# Patient Record
Sex: Female | Born: 1945 | Race: White | Hispanic: No | Marital: Married | Smoking: Never smoker
Health system: Southern US, Community
[De-identification: ages and names within clinical notes are randomized; demographics above are authoritative.]

## PROBLEM LIST (undated history)

## (undated) DIAGNOSIS — I639 Cerebral infarction, unspecified: Secondary | ICD-10-CM

## (undated) DIAGNOSIS — E785 Hyperlipidemia, unspecified: Secondary | ICD-10-CM

## (undated) HISTORY — PX: BREAST SURGERY: SHX581

## (undated) HISTORY — DX: Hyperlipidemia, unspecified: E78.5

## (undated) HISTORY — DX: Cerebral infarction, unspecified: I63.9

---

## 2000-03-15 ENCOUNTER — Other Ambulatory Visit: Admission: RE | Admit: 2000-03-15 | Discharge: 2000-03-15 | Payer: Self-pay | Admitting: Internal Medicine

## 2001-06-06 ENCOUNTER — Other Ambulatory Visit: Admission: RE | Admit: 2001-06-06 | Discharge: 2001-06-06 | Payer: Self-pay | Admitting: Internal Medicine

## 2003-04-04 ENCOUNTER — Other Ambulatory Visit: Admission: RE | Admit: 2003-04-04 | Discharge: 2003-04-04 | Payer: Self-pay | Admitting: Internal Medicine

## 2005-01-17 ENCOUNTER — Ambulatory Visit (HOSPITAL_COMMUNITY): Admission: RE | Admit: 2005-01-17 | Discharge: 2005-01-17 | Payer: Self-pay | Admitting: Gastroenterology

## 2005-05-25 ENCOUNTER — Emergency Department (HOSPITAL_COMMUNITY): Admission: EM | Admit: 2005-05-25 | Discharge: 2005-05-25 | Payer: Self-pay | Admitting: Family Medicine

## 2005-08-01 ENCOUNTER — Encounter: Admission: RE | Admit: 2005-08-01 | Discharge: 2005-08-01 | Payer: Self-pay | Admitting: Gastroenterology

## 2006-08-16 ENCOUNTER — Other Ambulatory Visit: Admission: RE | Admit: 2006-08-16 | Discharge: 2006-08-16 | Payer: Self-pay | Admitting: Family Medicine

## 2007-08-06 ENCOUNTER — Emergency Department (HOSPITAL_COMMUNITY): Admission: EM | Admit: 2007-08-06 | Discharge: 2007-08-06 | Payer: Self-pay | Admitting: Emergency Medicine

## 2007-08-27 ENCOUNTER — Other Ambulatory Visit: Admission: RE | Admit: 2007-08-27 | Discharge: 2007-08-27 | Payer: Self-pay | Admitting: Family Medicine

## 2008-11-06 ENCOUNTER — Other Ambulatory Visit: Admission: RE | Admit: 2008-11-06 | Discharge: 2008-11-06 | Payer: Self-pay | Admitting: Family Medicine

## 2010-08-06 ENCOUNTER — Other Ambulatory Visit: Admission: RE | Admit: 2010-08-06 | Discharge: 2010-08-06 | Payer: Self-pay | Admitting: Family Medicine

## 2011-03-18 NOTE — Op Note (Signed)
Tammy Erickson, Tammy Erickson               ACCOUNT NO.:  [id number]   MEDICAL RECORD NO.:  [id number]          PATIENT TYPE:  AMB   LOCATION:  ENDO                         FACILITY:  [facility]   PHYSICIAN:  Bernette Redbird, M.D.   DATE OF BIRTH:  Nov 12, 1945   DATE OF PROCEDURE:  01/17/2005  DATE OF DISCHARGE:                                 OPERATIVE REPORT   PROCEDURE:  Colonoscopy.   INDICATIONS:  A 65 year old female for colon cancer screening.   FINDINGS:  Normal exam.   PROCEDURE:  The nature, purpose, and risks of the procedure have been  discussed with the patient who provided written consent. Sedation was  fentanyl 130 mcg and Versed 10 mg IV prior to and during the course of this  procedure and the upper endoscopy which preceded it. The patient was  clinically stable throughout.   The Olympus, adjustable-tension, pediatric videocolonoscope was used for  this exam. It looped quite a bit, suggesting the adult scope might be better  on future occasions, although the sigmoid area was a little bit fixated for  the scope to negotiate.   With the help of the patient in the supine position and the external  abdominal compression, taking out loops and overriding additional loops, I  was able to get around the colon reach the cecum and enter the terminal  ileum which had a normal appearance and pullback was then performed. The  quality of prep was excellent; and it was felt that all areas were well  seen.   This was normal examination. No polyps, cancer, colitis, vascular  malformations, or diverticulosis were noted.  In retroflexion the rectum, as  well as, pullout through the anal canal demonstrated moderate internal  hemorrhoids with reinspection of the rectum being otherwise negative.   No biopsies were obtained. The patient tolerated the procedure well and  there no apparent complications.   IMPRESSION:  Normal screening colonoscopy (V76.51).   PLAN:  Flexible sigmoidoscopy in 5  years for continued screening.      RB/MEDQ  D:  01/17/2005  T:  01/17/2005  Job:  [id number]   cc:   Sharlet Salina, M.D.  [address]  [place]  [place] [address]  Fax: [phone]

## 2011-03-18 NOTE — Op Note (Signed)
Tammy Erickson, HANCOX NO.:  [id number]   MEDICAL RECORD NO.:  [id number]          PATIENT TYPE:  AMB   LOCATION:  ENDO                         FACILITY:  [facility]   PHYSICIAN:  Bernette Redbird, M.D.   DATE OF BIRTH:  November 21, 1945   DATE OF PROCEDURE:  01/17/2005  DATE OF DISCHARGE:                                 OPERATIVE REPORT   PROCEDURE:  Upper endoscopy Savary dilatation of esophagus.   INDICATION:  Intermittent dysphagia symptoms.   FINDINGS:  Changes suggestive of chronic reflux. Esophageal ring. Dilatation  performed to 18 mm.   PROCEDURE:  The nature, purpose and risks of the procedure had been  discussed with the patient who provided written consent. Sedation was  fentanyl 75 mcg and Versed 7.5 mg IV without arrhythmias or desaturation.  The Olympus small-caliber adult video endoscope was passed under direct  vision. The larynx looked entirely normal. The esophagus was readily  entered.   The mucosa of the distal esophagus to me looked a little bit rumpled and as  though there was squamous irregularity from chronic reflux exposure, without  any mass effect, active esophagitis, Barrett's esophagus, neoplasia, varices  or infection. There was Schatzki's ring below which was a small hiatal  hernia. The stomach contained no residual and had normal mucosa including a  retroflexed view of the cardia and the pylorus, duodenal bulb and second  duodenum looked normal. No gastritis, duodenitis, erosions, ulcers, polyps  or masses were seen.   Savary dilatation was performed in the standard fashion. Spring tipped  guidewire was advanced into the antrum of the stomach, the scope was removed  in an exchange fashion, and after appropriate confirmation of positioning of  the wire via fluoroscopy, a single passage was made with an 18 mm Savary  dilator, encountering some smooth resistance in the hypopharyngeal region  and then some degree of smooth resistance distally.  It was left in place a  few seconds and then removed with the guidewire after which I re-endoscope  the patient, which showed broad hemorrhagic slit in the distal esophagus  consistent with mucosal disruption but without evidence of free perforation.  It also appeared that her esophageal ring may have been fractured as well.  The stomach and hypopharynx looked okay.   The patient tolerated the procedure well and there no apparent  complications.   IMPRESSION:  1.  Dysphagia symptoms, now status post esophageal dilatation 18 mm (787.2)  2.  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 Based on      endoscopic findings postdilatation.   PLAN:  Clinical follow-up dysphagia symptoms. Initiate PPI therapy with  Prilosec OTC, office follow-up in about two months.      RB/MEDQ  D:  01/17/2005  T:  01/17/2005  Job:  [id number]

## 2011-08-09 ENCOUNTER — Other Ambulatory Visit: Payer: Self-pay | Admitting: Physician Assistant

## 2011-08-09 ENCOUNTER — Other Ambulatory Visit (HOSPITAL_COMMUNITY)
Admission: RE | Admit: 2011-08-09 | Discharge: 2011-08-09 | Disposition: A | Discharge status: Home / Self Care | Payer: BC Managed Care – PPO | Source: Ambulatory Visit | Attending: Family Medicine | Admitting: Family Medicine

## 2011-08-09 DIAGNOSIS — Z124 Encounter for screening for malignant neoplasm of cervix: Secondary | ICD-10-CM | POA: Insufficient documentation

## 2011-08-11 LAB — I-STAT 8, (EC8 V) (CONVERTED LAB)
Acid-Base Excess: 2
BUN: 12
Bicarbonate: 27.3 — ABNORMAL HIGH
Chloride: 107
Glucose, Bld: 95
HCT: 38
Hemoglobin: 12.9
Operator id: 151321
Potassium: 4
Sodium: 140
TCO2: 29
pCO2, Ven: 43.7 — ABNORMAL LOW
pH, Ven: 7.404 — ABNORMAL HIGH

## 2011-08-11 LAB — CBC
HCT: 36.8
Hemoglobin: 12.7
MCHC: 34.6
MCV: 90.6
Platelets: 256
RBC: 4.06
RDW: 12.6
WBC: 6.3

## 2011-08-11 LAB — DIFFERENTIAL
Basophils Absolute: 0
Basophils Relative: 0
Eosinophils Absolute: 0.2
Eosinophils Relative: 3
Lymphocytes Relative: 25
Lymphs Abs: 1.6
Monocytes Absolute: 0.5
Monocytes Relative: 9
Neutro Abs: 4
Neutrophils Relative %: 63

## 2011-08-11 LAB — POCT CARDIAC MARKERS
CKMB, poc: <1 — ABNORMAL LOW
CKMB, poc: <1 — ABNORMAL LOW
Myoglobin, poc: 52.9
Myoglobin, poc: 53.1
Operator id: 151321
Operator id: 151321
Troponin i, poc: <0.05
Troponin i, poc: <0.05

## 2011-08-11 LAB — POCT I-STAT CREATININE
Creatinine, Ser: 0.8
Operator id: 151321

## 2012-08-09 ENCOUNTER — Other Ambulatory Visit: Payer: Self-pay | Admitting: Physician Assistant

## 2012-08-09 ENCOUNTER — Other Ambulatory Visit (HOSPITAL_COMMUNITY)
Admission: RE | Admit: 2012-08-09 | Discharge: 2012-08-09 | Disposition: A | Discharge status: Home / Self Care | Payer: BC Managed Care – PPO | Source: Ambulatory Visit | Attending: Family Medicine | Admitting: Family Medicine

## 2012-08-09 DIAGNOSIS — Z124 Encounter for screening for malignant neoplasm of cervix: Secondary | ICD-10-CM | POA: Insufficient documentation

## 2016-08-28 ENCOUNTER — Emergency Department (HOSPITAL_BASED_OUTPATIENT_CLINIC_OR_DEPARTMENT_OTHER): Payer: BC Managed Care – PPO

## 2016-08-28 ENCOUNTER — Encounter (HOSPITAL_BASED_OUTPATIENT_CLINIC_OR_DEPARTMENT_OTHER): Payer: Self-pay | Admitting: Emergency Medicine

## 2016-08-28 ENCOUNTER — Emergency Department (HOSPITAL_BASED_OUTPATIENT_CLINIC_OR_DEPARTMENT_OTHER)
Admission: EM | Admit: 2016-08-28 | Discharge: 2016-08-28 | Disposition: A | Discharge status: Home / Self Care | Payer: BC Managed Care – PPO | Attending: Emergency Medicine | Admitting: Emergency Medicine

## 2016-08-28 DIAGNOSIS — W109XXA Fall (on) (from) unspecified stairs and steps, initial encounter: Secondary | ICD-10-CM | POA: Insufficient documentation

## 2016-08-28 DIAGNOSIS — S99912A Unspecified injury of left ankle, initial encounter: Secondary | ICD-10-CM | POA: Diagnosis present

## 2016-08-28 DIAGNOSIS — Y999 Unspecified external cause status: Secondary | ICD-10-CM | POA: Insufficient documentation

## 2016-08-28 DIAGNOSIS — S82832A Other fracture of upper and lower end of left fibula, initial encounter for closed fracture: Secondary | ICD-10-CM

## 2016-08-28 DIAGNOSIS — S89392A Other physeal fracture of lower end of left fibula, initial encounter for closed fracture: Secondary | ICD-10-CM | POA: Diagnosis not present

## 2016-08-28 DIAGNOSIS — Y929 Unspecified place or not applicable: Secondary | ICD-10-CM | POA: Insufficient documentation

## 2016-08-28 DIAGNOSIS — Y939 Activity, unspecified: Secondary | ICD-10-CM | POA: Insufficient documentation

## 2016-08-28 MED ORDER — HYDROCODONE-ACETAMINOPHEN 5-325 MG PO TABS: 0.5000 | ORAL_TABLET | Freq: Four times a day (QID) | ORAL | 0 refills | Status: DC | PRN

## 2016-08-28 MED ORDER — ACETAMINOPHEN 325 MG PO TABS
650.0000 mg | ORAL_TABLET | Freq: Once | ORAL | Status: AC
  Administered 2016-08-28: 650 mg via ORAL
  Filled 2016-08-28: qty 2

## 2016-08-28 NOTE — ED Provider Notes (Signed)
[facility] Note   CSN: [id number] Arrival date & time: 08/28/16  1817  By signing my name below, I, Clovis PuAvnee Patel, attest that this documentation has been prepared under the direction and in the presence of  Arvilla MeresAshley Meyer, PA-C. Electronically Signed: Clovis PuAvnee Patel, ED Scribe. 08/28/16. 8:20 PM.  History   Chief Complaint Chief Complaint  Patient presents with  . Ankle Pain    The history is provided by the patient. No language interpreter was used.   HPI Comments:  Tammy Erickson is a 70 y.o. female who presents to the Emergency Department complaining of sudden onset, moderate "8/10 throbbing" L ankle pain s/p an incident which occurred PTA. Pt states she missed a step on the stairs, her left foot bent backwards, and she felt a pop. She notes associated swelling to her L ankle. Pt denies head injury, LOC, numbness to her extremity, fevers, and being on any blood thinners. No alleviating factors noted. Pt denies any other symptoms or complaints at this time. No treatments tried PTA.   History reviewed. No pertinent past medical history.  There are no active problems to display for this patient.   Past Surgical History:  Procedure Laterality Date  . BREAST SURGERY      OB History    No data available       Home Medications    Prior to Admission medications   Medication Sig Start Date End Date Taking? Authorizing Provider  HYDROcodone-acetaminophen (NORCO/VICODIN) 5-325 MG tablet Take 0.5 tablets by mouth every 6 (six) hours as needed. 08/28/16   Lona KettleAshley Laurel Meyer, PA-C    Family History No family history on file.  Social History Social History  Substance Use Topics  . Smoking status: Never Smoker  . Smokeless tobacco: Never Used  . Alcohol use Yes     Allergies   Codeine; Doxycycline; Motrin [ibuprofen]; Penicillins; and Sulfa antibiotics   Review of Systems Review of Systems  Constitutional: Negative for fever.  Musculoskeletal:  Positive for arthralgias and joint swelling.  Neurological: Negative for syncope, numbness and headaches.     Physical Exam Updated Vital Signs BP 160/89 (BP Location: Left Arm)   Pulse 64   Temp 98.7 F (37.1 C) (Oral)   Resp 20   Ht 5\' 3"  (1.6 m)   Wt 72.6 kg   SpO2 98%   BMI 28.34 kg/m   Physical Exam  Constitutional: She appears well-developed and well-nourished. No distress.  HENT:  Head: Normocephalic and atraumatic.  Eyes: Conjunctivae are normal. No scleral icterus.  Neck: Normal range of motion.  Pulmonary/Chest: Effort normal. No respiratory distress.  Musculoskeletal: She exhibits edema and tenderness.       Left ankle: She exhibits swelling. Tenderness. Lateral malleolus tenderness found.  Left ankle:Obvious swelling to lateral malleolus. TTP of posterior lateral malleolus. Able to move toes. Sensation intact. DP pulse intact. Capillary refill <3seconds.   Neurological: She is alert. She is not disoriented. GCS eye subscore is 4. GCS verbal subscore is 5. GCS motor subscore is 6.  Skin: Skin is warm and dry. She is not diaphoretic.  Psychiatric: She has a normal mood and affect. Her behavior is normal.  Nursing note and vitals reviewed.    ED Treatments / Results  DIAGNOSTIC STUDIES:  Oxygen Saturation is 98% on RA, normal by my interpretation.    COORDINATION OF CARE:  8:18 PM Discussed treatment plan with pt at bedside and pt agreed to plan.  Labs (all labs ordered are  listed, but only abnormal results are displayed) Labs Reviewed - No data to display  EKG  EKG Interpretation None       Radiology Dg Ankle Complete Left  Result Date: 08/28/2016 CLINICAL DATA:  Left ankle pain and swelling after a fall on stairs today. EXAM: LEFT ANKLE COMPLETE - 3+ VIEW COMPARISON:  None. FINDINGS: There is a minimally displaced spiral fracture of the distal fibula with overlying soft tissue swelling. Small ankle effusion. Slight dorsal spurring at the  talonavicular joint. IMPRESSION: Minimally displaced spiral fracture of the distal fibula. Electronically Signed   By: Francene BoyersJames  Maxwell M.D.   On: 08/28/2016 18:48    Procedures Procedures (including critical care time)  Medications Ordered in ED Medications  acetaminophen (TYLENOL) tablet 650 mg (650 mg Oral Given 08/28/16 2031)     Initial Impression / Assessment and Plan / ED Course  I have reviewed the triage vital signs and the nursing notes.  Pertinent labs & imaging results that were available during my care of the patient were reviewed by me and considered in my medical decision making (see chart for details).  Clinical Course  Value Comment By Time  DG Ankle Complete Left Minimally displaced distal fibula fracture.  Lona Kettleshley Laurel Meyer, [place] 10/29 2023    Patient presents to ED with left ankle pain and swelling s/p injury onset today. Patient is afebrile and non-toxic appearing in NAD. VSS. Obvious swelling to left lateral malleolus with TTP. Neurovascularly intact. Patient X-Ray positive for left minimally displaced distal spiral fibula fracture. Patient given boot while in ED, conservative therapy recommended and discussed. Rx vicodin for severe pain. Review of [place] controlled substance database shows no recent narcotic rx. Pt advised to follow up with orthopedics, contact information provided. Patient will be discharged home & is agreeable with above plan. Returns precautions discussed. Patient voiced understanding and is agreeable. Pt appears safe for discharge.   Final Clinical Impressions(s) / ED Diagnoses   Final diagnoses:  Closed fracture of distal end of left fibula, unspecified fracture morphology, initial encounter    New Prescriptions New Prescriptions   HYDROCODONE-ACETAMINOPHEN (NORCO/VICODIN) 5-325 MG TABLET    Take 0.5 tablets by mouth every 6 (six) hours as needed.  I personally performed the services described in this documentation, which was scribed in my  presence. The recorded information has been reviewed and is accurate.     Lona Kettleshley Laurel Meyer, [place]-C 08/28/16 2038    Shaune Pollackameron Isaacs, MD 08/29/16 [phone]

## 2016-08-28 NOTE — ED Notes (Signed)
Pt states she missed a step and her left foot bent back. C/O pain and swelling to same. Moves toes. Feels touch. Cap refill < 3 sec. Ice applied.

## 2016-08-28 NOTE — ED Notes (Signed)
Pt and husband given d/c instructions as per chart. Verbalizes understanding. No questions. Rx x 1 with precautions.

## 2016-08-28 NOTE — Discharge Instructions (Signed)
Read the information below.  You have a fracture in your fibula. You are being placed in a walking boot. You can take tylenol 650mg  every 4-6hrs for pain relief. Elevate leg for 20 minute increments.  Please call orthopedics tomorrow to schedule a follow up appointment for further management.  You may return to the Emergency Department at any time for worsening condition or any new symptoms that concern you. Return to ED if uncontrolled pain, numbness in toes, increased swelling, or change in color of toes.

## 2016-08-28 NOTE — ED Notes (Signed)
PA at bedside.

## 2016-08-28 NOTE — ED Triage Notes (Signed)
Pt missed a step on the stairs and twisted her L ankle, c/o pain, swelling noted.

## 2016-09-01 ENCOUNTER — Encounter: Payer: Self-pay | Admitting: Family Medicine

## 2016-09-01 ENCOUNTER — Ambulatory Visit (INDEPENDENT_AMBULATORY_CARE_PROVIDER_SITE_OTHER): Payer: BC Managed Care – PPO | Admitting: Family Medicine

## 2016-09-01 DIAGNOSIS — S99912A Unspecified injury of left ankle, initial encounter: Secondary | ICD-10-CM

## 2016-09-01 MED ORDER — HYDROCODONE-ACETAMINOPHEN 5-325 MG PO TABS: 1.0000 | ORAL_TABLET | Freq: Four times a day (QID) | ORAL | 0 refills | Status: DC | PRN

## 2016-09-01 NOTE — Patient Instructions (Signed)
You have a distal fibula fracture. Icing 15 minutes at a time 3-4 times a day. Wear boot every time you're up and walking around - ok to take off to ice, wash, sleep. Make sure you come out of this at least a couple times a day to do up/down exercises of your ankle. Don't take tylenol if you're taking the hydrocodone. Aleve 2 tabs twice a day with food for pain and inflammation. Out of work for 2 weeks - follow up with me then. Elevate above your heart as needed for swelling.

## 2016-09-05 DIAGNOSIS — S99912D Unspecified injury of left ankle, subsequent encounter: Secondary | ICD-10-CM | POA: Insufficient documentation

## 2016-09-05 NOTE — Assessment & Plan Note (Signed)
independently reviewed radiographs - distal fibula fracture - medial portion appears to go to level of ankle joint.  Patient overall doing well.  Cam walker, icing, elevation.  Aleve as needed.  Hydrocodone as needed for severe pain.  F/u in 2 weeks.

## 2016-09-05 NOTE — Progress Notes (Signed)
PCP: Gasper LloydNoelle Redmond PA  Subjective:   HPI: Patient is a 70 y.o. female here for left ankle injury.  Patient reports she was going down stairs on 10/29 and missed a step, inverted her left ankle and foot bent backwards. Immediate pain, swelling.  No bruising. Has been taking tylenol. Pain level 4/10, sharp, lateral. No skin changes, numbness.  No past medical history on file.  No current outpatient prescriptions on file prior to visit.   No current facility-administered medications on file prior to visit.     Past Surgical History:  Procedure Laterality Date  . BREAST SURGERY      Allergies  Allergen Reactions  . Codeine   . Doxycycline   . Motrin [Ibuprofen]   . Penicillins   . Sulfa Antibiotics     Social History   Social History  . Marital status: Married    Spouse name: N/A  . Number of children: N/A  . Years of education: N/A   Occupational History  . Not on file.   Social History Main Topics  . Smoking status: Never Smoker  . Smokeless tobacco: Never Used  . Alcohol use Yes  . Drug use: Unknown  . Sexual activity: Not on file   Other Topics Concern  . Not on file   Social History Narrative  . No narrative on file    No family history on file.  BP (!) 159/94   Pulse 64   Ht 5\' 3"  (1.6 m)   Wt 160 lb (72.6 kg)   BMI 28.34 kg/m   Review of Systems: See HPI above.     Objective:  Physical Exam:  Gen: NAD, comfortable in exam room  Left ankle: Mod lateral swelling.  No bruising, other deformity. Mod limitation ROM all directions. TTP distal fibula, less over ATFL.  No medial tenderness. Deferred ant drawer and talar tilt. Thompsons test negative. NV intact distally.  Right ankle: FROM without pain.   Assessment & Plan:  1. Left ankle injury - independently reviewed radiographs - distal fibula fracture - medial portion appears to go to level of ankle joint.  Patient overall doing well.  Cam walker, icing, elevation.  Aleve as  needed.  Hydrocodone as needed for severe pain.  F/u in 2 weeks.

## 2016-09-15 ENCOUNTER — Encounter: Payer: Self-pay | Admitting: Family Medicine

## 2016-09-15 ENCOUNTER — Ambulatory Visit (HOSPITAL_BASED_OUTPATIENT_CLINIC_OR_DEPARTMENT_OTHER)
Admission: RE | Admit: 2016-09-15 | Discharge: 2016-09-15 | Disposition: A | Discharge status: Home / Self Care | Payer: BC Managed Care – PPO | Source: Ambulatory Visit | Attending: Family Medicine | Admitting: Family Medicine

## 2016-09-15 ENCOUNTER — Ambulatory Visit (INDEPENDENT_AMBULATORY_CARE_PROVIDER_SITE_OTHER): Payer: BC Managed Care – PPO | Admitting: Family Medicine

## 2016-09-15 VITALS — BP 163/100 | HR 60 | Ht 63.0 in | Wt 160.0 lb

## 2016-09-15 DIAGNOSIS — S99912A Unspecified injury of left ankle, initial encounter: Secondary | ICD-10-CM

## 2016-09-15 DIAGNOSIS — S82832D Other fracture of upper and lower end of left fibula, subsequent encounter for closed fracture with routine healing: Secondary | ICD-10-CM | POA: Diagnosis not present

## 2016-09-15 DIAGNOSIS — X58XXXA Exposure to other specified factors, initial encounter: Secondary | ICD-10-CM | POA: Insufficient documentation

## 2016-09-15 DIAGNOSIS — M7732 Calcaneal spur, left foot: Secondary | ICD-10-CM | POA: Diagnosis not present

## 2016-09-15 NOTE — Patient Instructions (Signed)
Continue with the boot until you follow up with me in 3 1/2 weeks. Icing 15 minutes at a time 3-4 times a day. Aleve only if needed. Come out of the boot at least a couple times a day to do motion exercises of the ankle as you have been. Call me with any questions or concerns.

## 2016-09-20 NOTE — Assessment & Plan Note (Signed)
independently reviewed repeat radiographs - distal fibula fracture - medial portion appears to go to level of ankle joint.  Clinically improving - questionable radiographic healing.  Will continue with boot for 3 1/2 more weeks, follow up with me at that time.  Icing, aleve if needed.  ROM exercises a couple times a day.

## 2016-09-20 NOTE — Progress Notes (Signed)
PCP: Gasper LloydNoelle Redmond PA  Subjective:   HPI: Patient is a 70 y.o. female here for left ankle injury.  11/2: Patient reports she was going down stairs on 10/29 and missed a step, inverted her left ankle and foot bent backwards. Immediate pain, swelling.  No bruising. Has been taking tylenol. Pain level 4/10, sharp, lateral. No skin changes, numbness.  11/16: Patient reports she is doing well. Pain level is 3/10, more dull now but still lateral. Doing home motion exercises, wearing ankle boot. Rarely taking aleve. Was icing at first but none now. No skin changes, numbness.  No past medical history on file.  Current Outpatient Prescriptions on File Prior to Visit  Medication Sig Dispense Refill  . citalopram (CELEXA) 10 MG tablet Take 10 mg by mouth daily.  0  . HYDROcodone-acetaminophen (NORCO/VICODIN) 5-325 MG tablet Take 1 tablet by mouth every 6 (six) hours as needed. 30 tablet 0   No current facility-administered medications on file prior to visit.     Past Surgical History:  Procedure Laterality Date  . BREAST SURGERY      Allergies  Allergen Reactions  . Codeine   . Doxycycline   . Motrin [Ibuprofen]   . Penicillins   . Sulfa Antibiotics     Social History   Social History  . Marital status: Married    Spouse name: N/A  . Number of children: N/A  . Years of education: N/A   Occupational History  . Not on file.   Social History Main Topics  . Smoking status: Never Smoker  . Smokeless tobacco: Never Used  . Alcohol use Yes  . Drug use: Unknown  . Sexual activity: Not on file   Other Topics Concern  . Not on file   Social History Narrative  . No narrative on file    No family history on file.  BP (!) 163/100   Pulse 60   Ht 5\' 3"  (1.6 m)   Wt 160 lb (72.6 kg)   BMI 28.34 kg/m   Review of Systems: See HPI above.     Objective:  Physical Exam:  Gen: NAD, comfortable in exam room  Left ankle: Mild lateral swelling.  No bruising,  other deformity. Mild limitation ROM all directions. Minimal TTP distal fibula.  No medial tenderness. Negative ant drawer and talar tilt. Thompsons test negative. NV intact distally.  Right ankle: FROM without pain.   Assessment & Plan:  1. Left ankle injury - independently reviewed repeat radiographs - distal fibula fracture - medial portion appears to go to level of ankle joint.  Clinically improving - questionable radiographic healing.  Will continue with boot for 3 1/2 more weeks, follow up with me at that time.  Icing, aleve if needed.  ROM exercises a couple times a day.

## 2016-10-11 ENCOUNTER — Ambulatory Visit (INDEPENDENT_AMBULATORY_CARE_PROVIDER_SITE_OTHER): Payer: BC Managed Care – PPO | Admitting: Family Medicine

## 2016-10-11 ENCOUNTER — Encounter: Payer: Self-pay | Admitting: Family Medicine

## 2016-10-11 DIAGNOSIS — S99912D Unspecified injury of left ankle, subsequent encounter: Secondary | ICD-10-CM

## 2016-10-11 NOTE — Patient Instructions (Signed)
Do theraband strengthening exercises most days of the week for next 6 weeks. Icing, elevation as needed. Compression sleeve or ACE wrap may help with the swelling also. Follow up with me in 6 weeks if you're having problems otherwise as needed.

## 2016-10-12 NOTE — Progress Notes (Signed)
PCP: Gasper LloydNoelle Redmond PA  Subjective:   HPI: Patient is a 70 y.o. female here for left ankle injury.  11/2: Patient reports she was going down stairs on 10/29 and missed a step, inverted her left ankle and foot bent backwards. Immediate pain, swelling.  No bruising. Has been taking tylenol. Pain level 4/10, sharp, lateral. No skin changes, numbness.  11/16: Patient reports she is doing well. Pain level is 3/10, more dull now but still lateral. Doing home motion exercises, wearing ankle boot. Rarely taking aleve. Was icing at first but none now. No skin changes, numbness.  12/12: Patient reports she is doing much better. Pain level is 1/10, a soreness. Still with swelling. No longer wearing the boot. No skin changes, numbness.  No past medical history on file.  Current Outpatient Prescriptions on File Prior to Visit  Medication Sig Dispense Refill  . citalopram (CELEXA) 10 MG tablet Take 10 mg by mouth daily.  0  . HYDROcodone-acetaminophen (NORCO/VICODIN) 5-325 MG tablet Take 1 tablet by mouth every 6 (six) hours as needed. 30 tablet 0   No current facility-administered medications on file prior to visit.     Past Surgical History:  Procedure Laterality Date  . BREAST SURGERY      Allergies  Allergen Reactions  . Codeine   . Doxycycline   . Motrin [Ibuprofen]   . Penicillins   . Sulfa Antibiotics     Social History   Social History  . Marital status: Married    Spouse name: N/A  . Number of children: N/A  . Years of education: N/A   Occupational History  . Not on file.   Social History Main Topics  . Smoking status: Never Smoker  . Smokeless tobacco: Never Used  . Alcohol use Yes  . Drug use: Unknown  . Sexual activity: Not on file   Other Topics Concern  . Not on file   Social History Narrative  . No narrative on file    No family history on file.  BP (!) 159/95   Pulse 64   Ht 5\' 3"  (1.6 m)   Wt 160 lb (72.6 kg)   BMI 28.34 kg/m    Review of Systems: See HPI above.     Objective:  Physical Exam:  Gen: NAD, comfortable in exam room  Left ankle: Mild lateral swelling.  No bruising, other deformity. FROM.   Minimal TTP distal fibula.  No medial tenderness. Negative ant drawer and talar tilt. Thompsons test negative. NV intact distally.  Right ankle: FROM without pain.   Assessment & Plan:  1. Left ankle injury - Clinically healed for most part with a tiny amount of tenderness.  Doing well out of boot.  Encouraged to start home exercises with theraband.  Icing, elevation, compression discussed.  Aleve only if needed.  F/u in 6 weeks if having problems.

## 2016-10-12 NOTE — Assessment & Plan Note (Signed)
Clinically healed for most part with a tiny amount of tenderness.  Doing well out of boot.  Encouraged to start home exercises with theraband.  Icing, elevation, compression discussed.  Aleve only if needed.  F/u in 6 weeks if having problems.

## 2018-01-08 IMAGING — CR DG ANKLE COMPLETE 3+V*L*
3 series · 3 of 3 positions shown · non-contrast
Comparison: None.

CLINICAL DATA: Left ankle pain and swelling after a fall on stairs
today.

EXAM:
LEFT ANKLE COMPLETE - 3+ VIEW

[t ankle joint ap left]
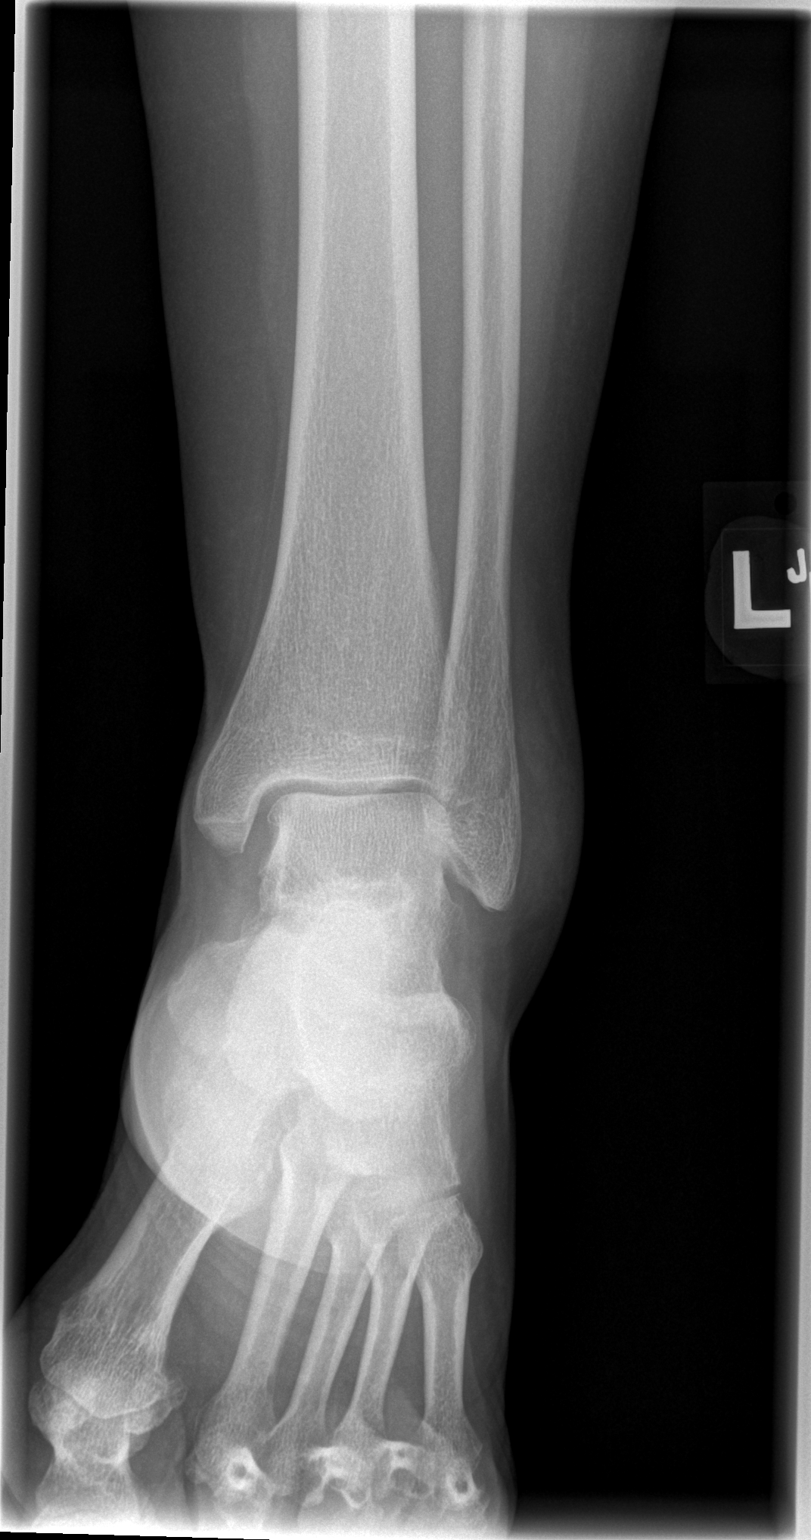

[t ankle joint oblique left]
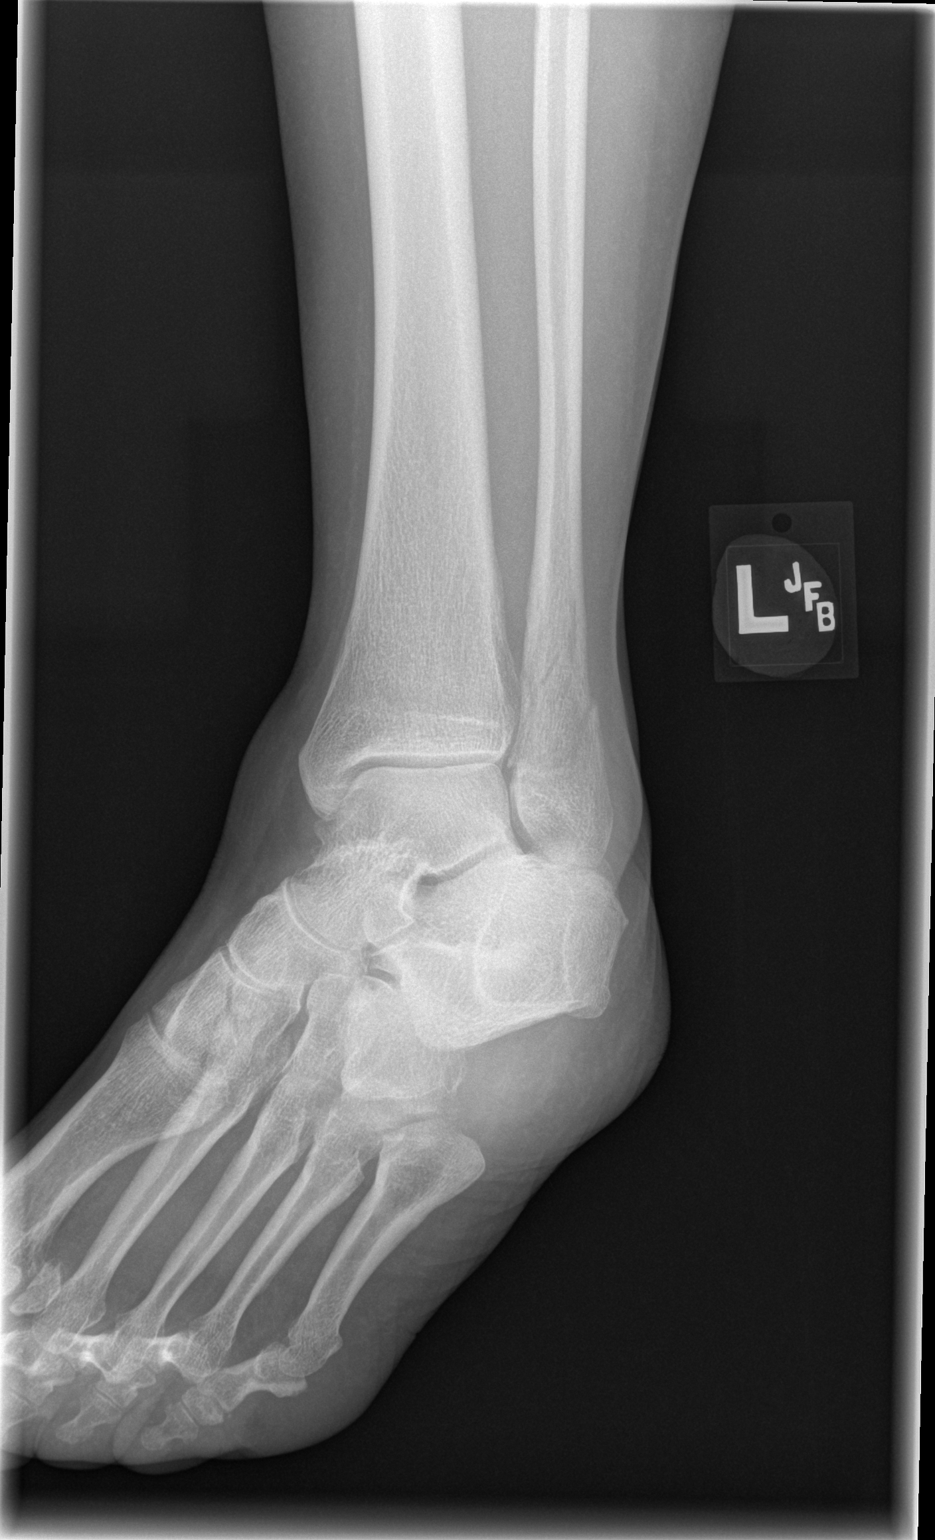

[t ankle joint lat left]
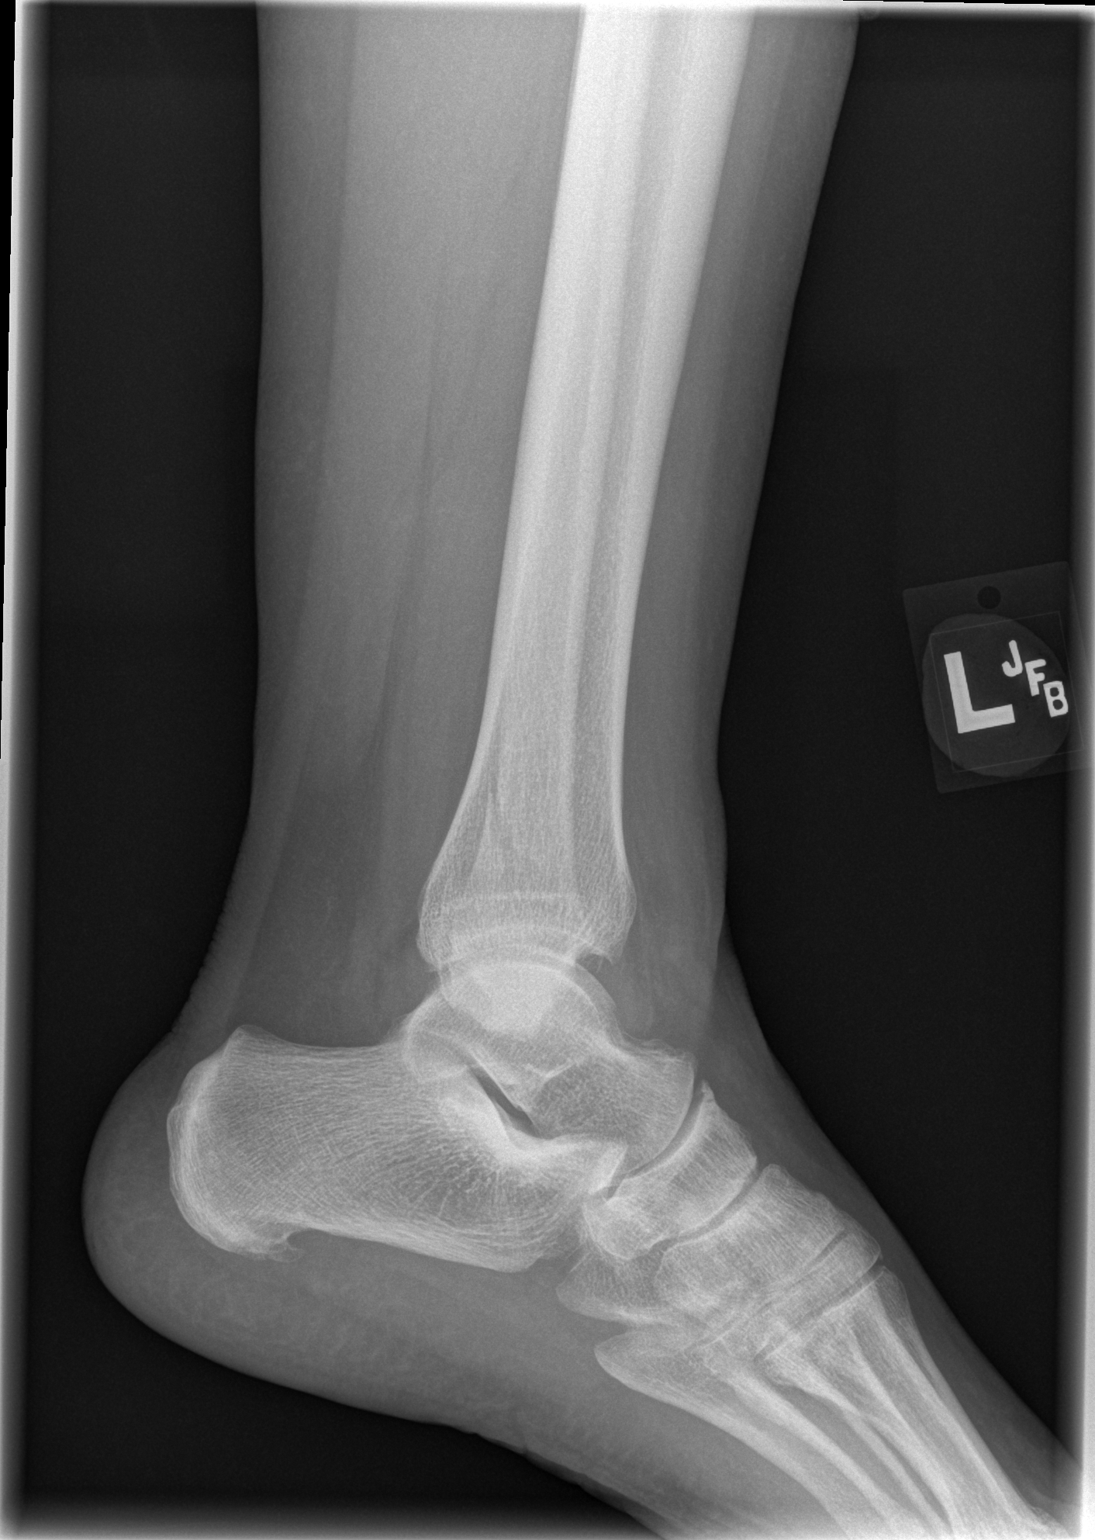

[3 of 3 positions shown; findings below may reference images not displayed]

FINDINGS: There is a minimally displaced spiral fracture of the distal fibula
with overlying soft tissue swelling. Small ankle effusion. Slight
dorsal spurring at the talonavicular joint.
IMPRESSION: Minimally displaced spiral fracture of the distal fibula.

## 2019-08-01 ENCOUNTER — Other Ambulatory Visit: Payer: Self-pay | Admitting: Emergency Medicine

## 2019-08-01 DIAGNOSIS — Z20822 Contact with and (suspected) exposure to covid-19: Secondary | ICD-10-CM

## 2019-08-02 LAB — NOVEL CORONAVIRUS, NAA: SARS-CoV-2, NAA: NOT DETECTED

## 2019-08-13 ENCOUNTER — Other Ambulatory Visit: Payer: Self-pay

## 2019-08-13 DIAGNOSIS — Z20822 Contact with and (suspected) exposure to covid-19: Secondary | ICD-10-CM

## 2019-08-15 LAB — NOVEL CORONAVIRUS, NAA: SARS-CoV-2, NAA: NOT DETECTED

## 2022-03-13 ENCOUNTER — Emergency Department (HOSPITAL_COMMUNITY)
Admission: EM | Admit: 2022-03-13 | Discharge: 2022-03-13 | Disposition: A | Discharge status: Home / Self Care | Payer: Medicare Other | Attending: Emergency Medicine | Admitting: Emergency Medicine

## 2022-03-13 ENCOUNTER — Encounter (HOSPITAL_COMMUNITY): Payer: Self-pay | Admitting: Emergency Medicine

## 2022-03-13 ENCOUNTER — Other Ambulatory Visit: Payer: Self-pay

## 2022-03-13 DIAGNOSIS — G039 Meningitis, unspecified: Secondary | ICD-10-CM | POA: Diagnosis present

## 2022-03-13 DIAGNOSIS — Z2089 Contact with and (suspected) exposure to other communicable diseases: Secondary | ICD-10-CM

## 2022-03-13 MED ORDER — CIPROFLOXACIN HCL 500 MG PO TABS
500.0000 mg | ORAL_TABLET | Freq: Once | ORAL | Status: AC
  Administered 2022-03-13: 500 mg via ORAL
  Filled 2022-03-13: qty 1

## 2022-03-13 NOTE — Discharge Instructions (Signed)
Return for new concerns. 

## 2022-03-13 NOTE — ED Triage Notes (Signed)
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  Patient here for prophylaxis.   ?

## 2022-03-13 NOTE — ED Provider Notes (Signed)
?[facility] EMERGENCY DEPARTMENT ?Provider Note ? ? ?CSN: [id number] ?Arrival date & time: 03/13/22  2017 ? ?  ? ?History ? ?Chief Complaint  ?Patient presents with  ? Injections  ? ? ?Tammy Erickson is a 76 y.o. female. ? ?Patient sent down from ICU for prophylactic treatment as husband diagnosed with meningitis. No sxs. Allergies to multiple antibiotics. No fevers. ? ? ?  ? ?Home Medications ?Prior to Admission medications   ?Medication Sig Start Date End Date Taking? Authorizing Provider  ?citalopram (CELEXA) 10 MG tablet Take 10 mg by mouth daily. 08/15/16   [provider]  ?HYDROcodone-acetaminophen (NORCO/VICODIN) 5-325 MG tablet Take 1 tablet by mouth every 6 (six) hours as needed. 09/01/16   Lenda Kelp, MD  ?   ? ?Allergies    ?Codeine, Doxycycline, Motrin [ibuprofen], Penicillins, and Sulfa antibiotics   ? ?Review of Systems   ?Review of Systems  ?Constitutional:  Negative for chills and fever.  ?HENT:  Negative for congestion.   ?Eyes:  Negative for visual disturbance.  ?Respiratory:  Negative for shortness of breath.   ?Cardiovascular:  Negative for chest pain.  ?Gastrointestinal:  Negative for abdominal pain and vomiting.  ?Genitourinary:  Negative for dysuria and flank pain.  ?Musculoskeletal:  Negative for back pain, neck pain and neck stiffness.  ?Skin:  Negative for rash.  ?Neurological:  Negative for light-headedness and headaches.  ? ?Physical Exam ?Updated Vital Signs ?BP (!) 171/87   Pulse 67   Temp 98.2 ?F (36.8 ?C) (Oral)   Resp 16   SpO2 99%  ?Physical Exam ?Vitals and nursing note reviewed.  ?Constitutional:   ?   General: She is not in acute distress. ?   Appearance: She is well-developed.  ?HENT:  ?   Head: Normocephalic and atraumatic.  ?   Mouth/Throat:  ?   Mouth: Mucous membranes are moist.  ?Eyes:  ?   General:     ?   Right eye: No discharge.     ?   Left eye: No discharge.  ?   Conjunctiva/sclera: Conjunctivae normal.  ?Neck:  ?   Trachea: No  tracheal deviation.  ?Cardiovascular:  ?   Rate and Rhythm: Normal rate.  ?Pulmonary:  ?   Effort: Pulmonary effort is normal.  ?   Breath sounds: Normal breath sounds.  ?Abdominal:  ?   General: There is no distension.  ?   Palpations: Abdomen is soft.  ?Musculoskeletal:  ?   Cervical back: Normal range of motion. No rigidity.  ?Skin: ?   General: Skin is warm.  ?   Capillary Refill: Capillary refill takes less than 2 seconds.  ?Neurological:  ?   General: No focal deficit present.  ?   Mental Status: She is alert.  ?Psychiatric:     ?   Mood and Affect: Mood normal.  ? ? ?ED Results / Procedures / Treatments   ?Labs ?(all labs ordered are listed, but only abnormal results are displayed) ?Labs Reviewed - No data to display ? ?EKG ?None ? ?Radiology ?No results found. ? ?Procedures ?Procedures  ? ? ?Medications Ordered in ED ?Medications  ?ciprofloxacin (CIPRO) tablet 500 mg (500 mg Oral Given 03/13/22 2045)  ? ? ?ED Course/ Medical Decision Making/ A&P ?  ?                        ?Medical Decision Making ?Risk ?Prescription drug management. ? ? ?Patient here for prophylaxis  secondary to meningitis exposure. Reviewed with pharmacy recommended cipro 500 mg due to allergies. Follow up discussed. ? ? ? ? ? ? ? ?Final Clinical Impression(s) / ED Diagnoses ?Final diagnoses:  ?Exposure to meningitis  ? ? ?Rx / DC Orders ?ED Discharge Orders   ? ? None  ? ?  ? ? ?  ?Blane Ohara, MD ?03/13/22 2053 ? ?

## 2022-11-01 DIAGNOSIS — Z0000 Encounter for general adult medical examination without abnormal findings: Secondary | ICD-10-CM | POA: Diagnosis not present

## 2022-11-01 DIAGNOSIS — M8580 Other specified disorders of bone density and structure, unspecified site: Secondary | ICD-10-CM | POA: Diagnosis not present

## 2022-11-01 DIAGNOSIS — E7800 Pure hypercholesterolemia, unspecified: Secondary | ICD-10-CM | POA: Diagnosis not present

## 2022-11-01 DIAGNOSIS — K219 Gastro-esophageal reflux disease without esophagitis: Secondary | ICD-10-CM | POA: Diagnosis not present

## 2022-12-12 ENCOUNTER — Encounter (HOSPITAL_BASED_OUTPATIENT_CLINIC_OR_DEPARTMENT_OTHER): Payer: Self-pay

## 2022-12-12 ENCOUNTER — Emergency Department (HOSPITAL_BASED_OUTPATIENT_CLINIC_OR_DEPARTMENT_OTHER): Payer: Medicare Other

## 2022-12-12 ENCOUNTER — Other Ambulatory Visit: Payer: Self-pay

## 2022-12-12 ENCOUNTER — Inpatient Hospital Stay (HOSPITAL_BASED_OUTPATIENT_CLINIC_OR_DEPARTMENT_OTHER)
Admission: EM | Admit: 2022-12-12 | Discharge: 2022-12-13 | DRG: 066 | Disposition: A | Discharge status: Home / Self Care | Payer: Medicare Other | Attending: Internal Medicine | Admitting: Internal Medicine

## 2022-12-12 DIAGNOSIS — R29702 NIHSS score 2: Secondary | ICD-10-CM | POA: Diagnosis not present

## 2022-12-12 DIAGNOSIS — E785 Hyperlipidemia, unspecified: Secondary | ICD-10-CM | POA: Diagnosis not present

## 2022-12-12 DIAGNOSIS — Z885 Allergy status to narcotic agent status: Secondary | ICD-10-CM

## 2022-12-12 DIAGNOSIS — F419 Anxiety disorder, unspecified: Secondary | ICD-10-CM | POA: Diagnosis not present

## 2022-12-12 DIAGNOSIS — I6523 Occlusion and stenosis of bilateral carotid arteries: Secondary | ICD-10-CM | POA: Diagnosis not present

## 2022-12-12 DIAGNOSIS — I160 Hypertensive urgency: Secondary | ICD-10-CM | POA: Diagnosis present

## 2022-12-12 DIAGNOSIS — Z880 Allergy status to penicillin: Secondary | ICD-10-CM | POA: Diagnosis not present

## 2022-12-12 DIAGNOSIS — R29898 Other symptoms and signs involving the musculoskeletal system: Secondary | ICD-10-CM | POA: Diagnosis present

## 2022-12-12 DIAGNOSIS — Z79899 Other long term (current) drug therapy: Secondary | ICD-10-CM

## 2022-12-12 DIAGNOSIS — K219 Gastro-esophageal reflux disease without esophagitis: Secondary | ICD-10-CM | POA: Diagnosis present

## 2022-12-12 DIAGNOSIS — I10 Essential (primary) hypertension: Secondary | ICD-10-CM | POA: Diagnosis not present

## 2022-12-12 DIAGNOSIS — I63542 Cerebral infarction due to unspecified occlusion or stenosis of left cerebellar artery: Principal | ICD-10-CM | POA: Diagnosis present

## 2022-12-12 DIAGNOSIS — I639 Cerebral infarction, unspecified: Secondary | ICD-10-CM | POA: Diagnosis not present

## 2022-12-12 DIAGNOSIS — I6389 Other cerebral infarction: Secondary | ICD-10-CM | POA: Diagnosis not present

## 2022-12-12 DIAGNOSIS — J341 Cyst and mucocele of nose and nasal sinus: Secondary | ICD-10-CM | POA: Diagnosis not present

## 2022-12-12 DIAGNOSIS — Z882 Allergy status to sulfonamides status: Secondary | ICD-10-CM

## 2022-12-12 DIAGNOSIS — Z881 Allergy status to other antibiotic agents status: Secondary | ICD-10-CM | POA: Diagnosis not present

## 2022-12-12 DIAGNOSIS — I69344 Monoplegia of lower limb following cerebral infarction affecting left non-dominant side: Secondary | ICD-10-CM

## 2022-12-12 DIAGNOSIS — R531 Weakness: Secondary | ICD-10-CM | POA: Diagnosis not present

## 2022-12-12 DIAGNOSIS — R42 Dizziness and giddiness: Secondary | ICD-10-CM | POA: Diagnosis not present

## 2022-12-12 DIAGNOSIS — R29818 Other symptoms and signs involving the nervous system: Secondary | ICD-10-CM | POA: Diagnosis not present

## 2022-12-12 DIAGNOSIS — J3489 Other specified disorders of nose and nasal sinuses: Secondary | ICD-10-CM | POA: Diagnosis not present

## 2022-12-12 LAB — DIFFERENTIAL
Abs Immature Granulocytes: 0.02 10*3/uL (ref 0.00–0.07)
Basophils Absolute: 0.1 10*3/uL (ref 0.0–0.1)
Basophils Relative: 1 %
Eosinophils Absolute: 0.4 10*3/uL (ref 0.0–0.5)
Eosinophils Relative: 4 %
Immature Granulocytes: 0 %
Lymphocytes Relative: 42 %
Lymphs Abs: 3.4 10*3/uL (ref 0.7–4.0)
Monocytes Absolute: 0.6 10*3/uL (ref 0.1–1.0)
Monocytes Relative: 8 %
Neutro Abs: 3.6 10*3/uL (ref 1.7–7.7)
Neutrophils Relative %: 45 %

## 2022-12-12 LAB — COMPREHENSIVE METABOLIC PANEL
ALT: 18 U/L (ref 0–44)
AST: 22 U/L (ref 15–41)
Albumin: 4 g/dL (ref 3.5–5.0)
Alkaline Phosphatase: 71 U/L (ref 38–126)
Anion gap: 5 (ref 5–15)
BUN: 26 mg/dL — ABNORMAL HIGH (ref 8–23)
CO2: 27 mmol/L (ref 22–32)
Calcium: 8.8 mg/dL — ABNORMAL LOW (ref 8.9–10.3)
Chloride: 105 mmol/L (ref 98–111)
Creatinine, Ser: 0.8 mg/dL (ref 0.44–1.00)
GFR, Estimated: >60 mL/min (ref >60)
Glucose, Bld: 108 mg/dL — ABNORMAL HIGH (ref 70–99)
Potassium: 3.7 mmol/L (ref 3.5–5.1)
Sodium: 137 mmol/L (ref 135–145)
Total Bilirubin: 0.6 mg/dL (ref 0.3–1.2)
Total Protein: 7.1 g/dL (ref 6.5–8.1)

## 2022-12-12 LAB — CBC
HCT: 37 % (ref 36.0–46.0)
Hemoglobin: 12.6 g/dL (ref 12.0–15.0)
MCH: 30.7 pg (ref 26.0–34.0)
MCHC: 34.1 g/dL (ref 30.0–36.0)
MCV: 90.2 fL (ref 80.0–100.0)
Platelets: 239 10*3/uL (ref 150–400)
RBC: 4.1 MIL/uL (ref 3.87–5.11)
RDW: 12.9 % (ref 11.5–15.5)
WBC: 8.1 10*3/uL (ref 4.0–10.5)
nRBC: 0 % (ref 0.0–0.2)

## 2022-12-12 LAB — GLUCOSE, CAPILLARY: Glucose-Capillary: 120 mg/dL — ABNORMAL HIGH (ref 70–99)

## 2022-12-12 LAB — ETHANOL: Alcohol, Ethyl (B): <10 mg/dL (ref <10)

## 2022-12-12 MED ORDER — PANTOPRAZOLE SODIUM 40 MG PO TBEC
40.0000 mg | DELAYED_RELEASE_TABLET | Freq: Every day | ORAL | Status: DC
  Administered 2022-12-13: 40 mg via ORAL
  Filled 2022-12-12: qty 1

## 2022-12-12 MED ORDER — SODIUM CHLORIDE 0.9% FLUSH
3.0000 mL | Freq: Once | INTRAVENOUS | Status: DC
  Filled 2022-12-12: qty 3

## 2022-12-12 MED ORDER — ACETAMINOPHEN 650 MG RE SUPP: 650.0000 mg | RECTAL | Status: DC | PRN

## 2022-12-12 MED ORDER — SODIUM CHLORIDE 0.9 % IV SOLN: INTRAVENOUS | Status: DC

## 2022-12-12 MED ORDER — CITALOPRAM HYDROBROMIDE 20 MG PO TABS
10.0000 mg | ORAL_TABLET | Freq: Every day | ORAL | Status: DC
  Administered 2022-12-13: 10 mg via ORAL
  Filled 2022-12-12: qty 1

## 2022-12-12 MED ORDER — SENNOSIDES-DOCUSATE SODIUM 8.6-50 MG PO TABS: 1.0000 | ORAL_TABLET | Freq: Every evening | ORAL | Status: DC | PRN

## 2022-12-12 MED ORDER — LABETALOL HCL 5 MG/ML IV SOLN: 10.0000 mg | INTRAVENOUS | Status: DC | PRN

## 2022-12-12 MED ORDER — ACETAMINOPHEN 160 MG/5ML PO SOLN: 650.0000 mg | ORAL | Status: DC | PRN

## 2022-12-12 MED ORDER — ACETAMINOPHEN 325 MG PO TABS: 650.0000 mg | ORAL_TABLET | ORAL | Status: DC | PRN

## 2022-12-12 MED ORDER — LORAZEPAM 2 MG/ML IJ SOLN
1.0000 mg | Freq: Once | INTRAMUSCULAR | Status: AC | PRN
  Administered 2022-12-13: 1 mg via INTRAVENOUS
  Filled 2022-12-12: qty 1

## 2022-12-12 MED ORDER — STROKE: EARLY STAGES OF RECOVERY BOOK
Freq: Once | Status: AC
  Filled 2022-12-12: qty 1

## 2022-12-12 MED ORDER — ACETAMINOPHEN 160 MG/5ML PO SOLN
500.0000 mg | Freq: Four times a day (QID) | ORAL | Status: DC | PRN
  Administered 2022-12-13: 500 mg via ORAL
  Filled 2022-12-12: qty 20.3

## 2022-12-12 MED ORDER — ENOXAPARIN SODIUM 40 MG/0.4ML IJ SOSY
40.0000 mg | PREFILLED_SYRINGE | INTRAMUSCULAR | Status: DC
  Administered 2022-12-12: 40 mg via SUBCUTANEOUS
  Filled 2022-12-12: qty 0.4

## 2022-12-12 NOTE — H&P (Signed)
History and Physical    [name] [id number] DOB: [birth year] DOA: 12/12/2022  PCP: Lennie Odor, PA   Patient coming from: Home   Chief Complaint: Dizzy, left leg weakness   HPI: Tammy Erickson is a pleasant 77 y.o. female with medical history significant for anxiety, hyperlipidemia, and GERD who presents to the emergency department with a dizziness and left leg weakness.  Patient reports that she had a mild headache, fatigue, and palpitations yesterday but was able to go about her usual activities.  She felt well upon waking at 7:30 this morning, but began to experience a room spinning sensation and left leg weakness at approximately 9 AM.  The left leg weakness seemed to come and go.  She has not had any focal weakness since her arrival in the ED, feels normal while resting in bed, but continues to experience a room spinning sensation when she gets up.  [facility] ED Course: Upon arrival to the ED, patient is found to be afebrile and saturating well on room air with elevated blood pressures.  EKG demonstrates sinus rhythm and head CT is negative for acute intracranial abnormality.  Blood work is notable for elevated BUN to creatinine ratio.  Neurology was consulted by the ED physician and the patient was transferred to [facility] for admission.  Review of Systems:  All other systems reviewed and apart from HPI, are negative.  History reviewed. No pertinent past medical history.  Past Surgical History:  Procedure Laterality Date   BREAST SURGERY      Social History:   reports that she has never smoked. She has never used smokeless tobacco. She reports current alcohol use. No history on file for drug use.  Allergies  Allergen Reactions   Codeine    Doxycycline    Motrin [Ibuprofen]    Penicillins    Sulfa Antibiotics     History reviewed. No pertinent family history.   Prior to Admission medications   Medication Sig Start Date End Date Taking? Authorizing  Provider  citalopram (CELEXA) 10 MG tablet Take 10 mg by mouth daily. 08/15/16   [provider]  HYDROcodone-acetaminophen (NORCO/VICODIN) 5-325 MG tablet Take 1 tablet by mouth every 6 (six) hours as needed. 09/01/16   Dene Gentry, MD    Physical Exam: Vitals:   12/12/22 1726 12/12/22 1730 12/12/22 1841 12/12/22 1848  BP: (!) 195/106 (!) 180/99 (!) 157/94   Pulse: (!) 59 (!) 59  (!) 58  Resp: (!) 9 13  12  $ Temp:      TempSrc:      SpO2: 100% 99%  98%  Weight:      Height:        Constitutional: NAD, calm  Eyes: PERTLA, lids and conjunctivae normal ENMT: Mucous membranes are moist. Posterior pharynx clear of any exudate or lesions.   Neck: supple, no masses  Respiratory: no wheezing, no crackles. No accessory muscle use.  Cardiovascular: S1 & S2 heard, regular rate and rhythm. No JVD. Abdomen: No distension, no tenderness, soft. Bowel sounds active.  Musculoskeletal: no clubbing / cyanosis. No joint deformity upper and lower extremities.   Skin: no significant rashes, lesions, ulcers. Warm, dry, well-perfused. Neurologic: CN 2-12 grossly intact. Sensation intact. Strength 5/5 in all 4 limbs. Alert and oriented.  Psychiatric: Pleasant. Cooperative.    Labs and Imaging on Admission: I have personally reviewed following labs and imaging studies  CBC: Recent Labs  Lab 12/12/22 1522  WBC 8.1  NEUTROABS 3.6  HGB 12.6  HCT 37.0  MCV 90.2  PLT [id number]   Basic Metabolic Panel: Recent Labs  Lab 12/12/22 1522  NA 137  K 3.7  CL 105  CO2 27  GLUCOSE 108*  BUN 26*  CREATININE 0.80  CALCIUM 8.8*   GFR: Estimated Creatinine Clearance: 58.9 mL/min (by C-G formula based on SCr of 0.8 mg/dL). Liver Function Tests: Recent Labs  Lab 12/12/22 1522  AST 22  ALT 18  ALKPHOS 71  BILITOT 0.6  PROT 7.1  ALBUMIN 4.0   No results for input(s): "LIPASE", "AMYLASE" in the last 168 hours. No results for input(s): "AMMONIA" in the last 168 hours. Coagulation  Profile: No results for input(s): "INR", "PROTIME" in the last 168 hours. Cardiac Enzymes: No results for input(s): "CKTOTAL", "CKMB", "CKMBINDEX", "TROPONINI" in the last 168 hours. BNP (last 3 results) No results for input(s): "PROBNP" in the last 8760 hours. HbA1C: No results for input(s): "HGBA1C" in the last 72 hours. CBG: Recent Labs  Lab 12/12/22 1533  GLUCAP 120*   Lipid Profile: No results for input(s): "CHOL", "HDL", "LDLCALC", "TRIG", "CHOLHDL", "LDLDIRECT" in the last 72 hours. Thyroid Function Tests: No results for input(s): "TSH", "T4TOTAL", "FREET4", "T3FREE", "THYROIDAB" in the last 72 hours. Anemia Panel: No results for input(s): "VITAMINB12", "FOLATE", "FERRITIN", "TIBC", "IRON", "RETICCTPCT" in the last 72 hours. Urine analysis: No results found for: "COLORURINE", "APPEARANCEUR", "LABSPEC", "PHURINE", "GLUCOSEU", "HGBUR", "BILIRUBINUR", "KETONESUR", "PROTEINUR", "UROBILINOGEN", "NITRITE", "LEUKOCYTESUR" Sepsis Labs: @LABRCNTIP$ (procalcitonin:4,lacticidven:4) )No results found for this or any previous visit (from the past 240 hour(s)).   Radiological Exams on Admission: CT HEAD WO CONTRAST  Result Date: 12/12/2022 CLINICAL DATA:  Neuro deficit, acute, stroke suspected EXAM: CT HEAD WITHOUT CONTRAST TECHNIQUE: Contiguous axial images were obtained from the base of the skull through the vertex without intravenous contrast. RADIATION DOSE REDUCTION: This exam was performed according to the departmental dose-optimization program which includes automated exposure control, adjustment of the mA and/or kV according to patient size and/or use of iterative reconstruction technique. COMPARISON:  None Available. FINDINGS: Brain: No evidence of acute infarction, hemorrhage, hydrocephalus, extra-axial collection or mass lesion/mass effect. Vascular: No hyperdense vessel identified. Skull: No acute fracture. Sinuses/Orbits: Scattered paranasal sinus mucosal thickening. No acute orbital  findings. Other: No mastoid effusions. IMPRESSION: No evidence of acute intracranial abnormality. Electronically Signed   By: Margaretha Sheffield M.D.   On: 12/12/2022 15:46    EKG: Independently reviewed. Sinus rhythm, PACs.   Assessment/Plan   1. Transient left leg weakness; vertigo  - No acute findings on head CT  - Appreciate neurology consultation  - Continue cardiac monitoring and frequent neuro checks; check MRI brain, CTA head and neck, echocardiogram, lipids, and A1c; consult PT/OT/SLP; antiplatelets per neurology    2. Hypertensive urgency  - BP as high as 195/106 in ED, now improved without intervention  - Permit HTN to [id number] for now if no end-organ damage   3. Anxiety  - Continue Celexa     DVT prophylaxis: Lovenox  Code Status: Full  Level of Care: Level of care: Telemetry Medical Family Communication: None present  Disposition Plan:  Patient is from: Home  Anticipated d/c is to: Home  Anticipated d/c date is: 2/13 or 12/14/22  Patient currently: Pending TIA/CVA workup  Consults called: Neurology  Admission status: Inpatient     Vianne Bulls, MD [facility]  12/12/2022, 9:46 PM

## 2022-12-12 NOTE — ED Notes (Signed)
[facility] at bedside 

## 2022-12-12 NOTE — ED Provider Notes (Signed)
[facility] EMERGENCY DEPARTMENT AT [place] Provider Note   CSN: [id number] Arrival date & time: 12/12/22  1509     History  Chief Complaint  Patient presents with   Dizziness    Tammy Erickson is a 77 y.o. female.  77 yo F with a chief complaints of left leg weakness and dizziness.  She feels like this has been on and off since about 9 AM.  She denies any head injury or injury to her neck.  She has had a bit of a nagging headache which is not uncommon for her.  She denied any issues with her upper extremities.  She feels like her leg weakness is better.  Has had a couple times today where she felt like she was falling to the left and had a lot of trouble keeping herself from going down.  She denies any trouble talking or swallowing.  Denies history of stroke.   Dizziness      Home Medications Prior to Admission medications   Medication Sig Start Date End Date Taking? Authorizing Provider  citalopram (CELEXA) 10 MG tablet Take 10 mg by mouth daily. 08/15/16   [provider]  HYDROcodone-acetaminophen (NORCO/VICODIN) 5-325 MG tablet Take 1 tablet by mouth every 6 (six) hours as needed. 09/01/16   Dene Gentry, MD      Allergies    Codeine, Doxycycline, Motrin [ibuprofen], Penicillins, and Sulfa antibiotics    Review of Systems   Review of Systems  Neurological:  Positive for dizziness.    Physical Exam Updated Vital Signs BP (!) 157/94   Pulse (!) 58   Temp 98.2 F (36.8 C) (Oral)   Resp 12   Ht 5' 3"$  (1.6 m)   Wt 72.6 kg   SpO2 98%   BMI 28.34 kg/m  Physical Exam Vitals and nursing note reviewed.  Constitutional:      General: She is not in acute distress.    Appearance: She is well-developed. She is not diaphoretic.  HENT:     Head: Normocephalic and atraumatic.  Eyes:     Pupils: Pupils are equal, round, and reactive to light.  Cardiovascular:     Rate and Rhythm: Normal rate and regular rhythm.     Heart sounds: No murmur  heard.    No friction rub. No gallop.  Pulmonary:     Effort: Pulmonary effort is normal.     Breath sounds: No wheezing or rales.  Abdominal:     General: There is no distension.     Palpations: Abdomen is soft.     Tenderness: There is no abdominal tenderness.  Musculoskeletal:        General: No tenderness.     Cervical back: Normal range of motion and neck supple.  Skin:    General: Skin is warm and dry.  Neurological:     Mental Status: She is alert and oriented to person, place, and time.     Cranial Nerves: Cranial nerves 2-12 are intact.     Sensory: Sensation is intact.     Comments: 4 out of 5 muscle strength of the left lower extremity compared to the right.  No obvious cerebellar signs.  Psychiatric:        Behavior: Behavior normal.     ED Results / Procedures / Treatments   Labs (all labs ordered are listed, but only abnormal results are displayed) Labs Reviewed  COMPREHENSIVE METABOLIC PANEL - Abnormal; Notable for the following components:  Result Value   Glucose, Bld 108 (*)    BUN 26 (*)    Calcium 8.8 (*)    All other components within normal limits  GLUCOSE, CAPILLARY - Abnormal; Notable for the following components:   Glucose-Capillary 120 (*)    All other components within normal limits  CBC  DIFFERENTIAL  ETHANOL  PROTIME-INR  APTT  CBG MONITORING, ED    EKG EKG Interpretation  Date/Time:  Monday December 12 2022 15:20:45 EST Ventricular Rate:  65 PR Interval:  151 QRS Duration: 104 QT Interval:  445 QTC Calculation: 463 R Axis:   42 Text Interpretation: Sinus rhythm Atrial premature complexes Low voltage, precordial leads No significant change since last tracing Confirmed by Deno Etienne [phone]) on 12/12/2022 3:42:43 PM  Radiology CT HEAD WO CONTRAST  Result Date: 12/12/2022 CLINICAL DATA:  Neuro deficit, acute, stroke suspected EXAM: CT HEAD WITHOUT CONTRAST TECHNIQUE: Contiguous axial images were obtained from the base of the  skull through the vertex without intravenous contrast. RADIATION DOSE REDUCTION: This exam was performed according to the departmental dose-optimization program which includes automated exposure control, adjustment of the mA and/or kV according to patient size and/or use of iterative reconstruction technique. COMPARISON:  None Available. FINDINGS: Brain: No evidence of acute infarction, hemorrhage, hydrocephalus, extra-axial collection or mass lesion/mass effect. Vascular: No hyperdense vessel identified. Skull: No acute fracture. Sinuses/Orbits: Scattered paranasal sinus mucosal thickening. No acute orbital findings. Other: No mastoid effusions. IMPRESSION: No evidence of acute intracranial abnormality. Electronically Signed   By: Margaretha Sheffield M.D.   On: 12/12/2022 15:46    Procedures Procedures    Medications Ordered in ED Medications  sodium chloride flush (NS) 0.9 % injection 3 mL (3 mLs Intravenous Not Given 12/12/22 1620)    ED Course/ Medical Decision Making/ A&P                             Medical Decision Making Amount and/or Complexity of Data Reviewed Labs: ordered. Radiology: ordered.  Risk Decision regarding hospitalization.   77 yo F with a chief complaints of dizziness and left leg weakness.  This started this morning and she has noticed it off and on.  She had an episode where she could not move her leg at all and a friend of hers made her come in to be evaluated.  She said since then her leg weakness has improved significantly.  She does not notice it anymore.  No significant past medical history.  Is quite hypertensive here.  Does have left leg weakness for me on exam.  Will discuss with neurology.  CT of the head is negative as independently interpreted by me.  No significant anemia.  The patients results and plan were reviewed and discussed.   Any x-rays performed were independently reviewed by myself.   Differential diagnosis were considered with the  presenting HPI.  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          Final Clinical Impression(s) / ED Diagnoses Final diagnoses:  Acute stroke due to ischemia [facility])    Rx / DC Orders ED Discharge Orders     None         Deno Etienne, DO 12/12/22 1915

## 2022-12-12 NOTE — Plan of Care (Signed)

## 2022-12-12 NOTE — ED Notes (Signed)
EDP Tammy Erickson made aware of pts elevated BP, per EDP pt is to remain with permissive hypertension due to current stroke workup.

## 2022-12-12 NOTE — ED Triage Notes (Signed)
Pt arrives with c/o dizziness and left leg weakness that started around 9am. Per pt, leg weakness is intermittent. Pt endorses a mild headache earlier today also. Pt denies slurred speech or facial droop. Pt a&ox4. Per pt, the leg weakness has resolved at this time. Pt ambulatory to triage without difficulty.

## 2022-12-13 ENCOUNTER — Inpatient Hospital Stay (HOSPITAL_COMMUNITY): Payer: Medicare Other

## 2022-12-13 ENCOUNTER — Other Ambulatory Visit (HOSPITAL_COMMUNITY): Payer: Medicare Other

## 2022-12-13 ENCOUNTER — Other Ambulatory Visit: Payer: Self-pay | Admitting: Home Health

## 2022-12-13 DIAGNOSIS — I639 Cerebral infarction, unspecified: Secondary | ICD-10-CM

## 2022-12-13 DIAGNOSIS — R29898 Other symptoms and signs involving the musculoskeletal system: Secondary | ICD-10-CM

## 2022-12-13 DIAGNOSIS — I6523 Occlusion and stenosis of bilateral carotid arteries: Secondary | ICD-10-CM

## 2022-12-13 DIAGNOSIS — I6389 Other cerebral infarction: Secondary | ICD-10-CM

## 2022-12-13 DIAGNOSIS — I491 Atrial premature depolarization: Secondary | ICD-10-CM

## 2022-12-13 DIAGNOSIS — E785 Hyperlipidemia, unspecified: Secondary | ICD-10-CM | POA: Diagnosis present

## 2022-12-13 LAB — LIPID PANEL
Cholesterol: 181 mg/dL (ref 0–200)
HDL: 62 mg/dL (ref >40)
LDL Cholesterol: 110 mg/dL — ABNORMAL HIGH (ref 0–99)
Total CHOL/HDL Ratio: 2.9 RATIO
Triglycerides: 45 mg/dL (ref <150)
VLDL: 9 mg/dL (ref 0–40)

## 2022-12-13 LAB — HEMOGLOBIN A1C
Hgb A1c MFr Bld: 5.2 % (ref 4.8–5.6)
Mean Plasma Glucose: 102.54 mg/dL

## 2022-12-13 LAB — CBC
HCT: 34.5 % — ABNORMAL LOW (ref 36.0–46.0)
Hemoglobin: 12.3 g/dL (ref 12.0–15.0)
MCH: 31.7 pg (ref 26.0–34.0)
MCHC: 35.7 g/dL (ref 30.0–36.0)
MCV: 88.9 fL (ref 80.0–100.0)
Platelets: 211 10*3/uL (ref 150–400)
RBC: 3.88 MIL/uL (ref 3.87–5.11)
RDW: 12.8 % (ref 11.5–15.5)
WBC: 5.7 10*3/uL (ref 4.0–10.5)
nRBC: 0 % (ref 0.0–0.2)

## 2022-12-13 LAB — ECHOCARDIOGRAM COMPLETE
Area-P 1/2: 2.48 cm2
Calc EF: 61.8 %
S' Lateral: 2.5 cm
Single Plane A2C EF: 65.6 %
Single Plane A4C EF: 59.7 %

## 2022-12-13 LAB — BASIC METABOLIC PANEL
Anion gap: 10 (ref 5–15)
BUN: 21 mg/dL (ref 8–23)
CO2: 23 mmol/L (ref 22–32)
Calcium: 8.7 mg/dL — ABNORMAL LOW (ref 8.9–10.3)
Chloride: 107 mmol/L (ref 98–111)
Creatinine, Ser: 0.71 mg/dL (ref 0.44–1.00)
GFR, Estimated: >60 mL/min (ref >60)
Glucose, Bld: 99 mg/dL (ref 70–99)
Potassium: 3.6 mmol/L (ref 3.5–5.1)
Sodium: 140 mmol/L (ref 135–145)

## 2022-12-13 MED ORDER — TETRAHYDROZOLINE HCL 0.05 % OP SOLN
1.0000 [drp] | Freq: Every day | OPHTHALMIC | Status: DC
  Administered 2022-12-13: 1 [drp] via OPHTHALMIC
  Filled 2022-12-13: qty 15

## 2022-12-13 MED ORDER — FLUTICASONE PROPIONATE 50 MCG/ACT NA SUSP: 1.0000 | Freq: Every day | NASAL | Status: DC | PRN

## 2022-12-13 MED ORDER — ATORVASTATIN CALCIUM 40 MG PO TABS: 40.0000 mg | ORAL_TABLET | Freq: Every day | ORAL | Status: DC

## 2022-12-13 MED ORDER — VITAMIN C 500 MG PO TABS
500.0000 mg | ORAL_TABLET | Freq: Every day | ORAL | Status: DC
  Administered 2022-12-13: 500 mg via ORAL
  Filled 2022-12-13: qty 1

## 2022-12-13 MED ORDER — CLOPIDOGREL BISULFATE 75 MG PO TABS: 75.0000 mg | ORAL_TABLET | Freq: Every day | ORAL | 0 refills | Status: AC

## 2022-12-13 MED ORDER — STUDY - OCEANIC-STROKE - ASUNDEXIAN 50 MG OR PLACEBO TABLET (PI-SETHI)
1.0000 | ORAL_TABLET | Freq: Every day | ORAL | Status: DC
  Administered 2022-12-13: 50 mg via ORAL
  Filled 2022-12-13: qty 1

## 2022-12-13 MED ORDER — ASPIRIN 300 MG RE SUPP: 300.0000 mg | Freq: Every day | RECTAL | Status: DC

## 2022-12-13 MED ORDER — ATORVASTATIN CALCIUM 10 MG PO TABS: 20.0000 mg | ORAL_TABLET | Freq: Every day | ORAL | Status: DC

## 2022-12-13 MED ORDER — ASPIRIN 81 MG PO CHEW: 81.0000 mg | CHEWABLE_TABLET | Freq: Every day | ORAL | 0 refills | Status: AC

## 2022-12-13 MED ORDER — STUDY - OCEANIC-STROKE - ASUNDEXIAN 50 MG OR PLACEBO TABLET (PI-SETHI): 1.0000 | ORAL_TABLET | Freq: Every day | ORAL | 0 refills | Status: DC

## 2022-12-13 MED ORDER — LABETALOL HCL 5 MG/ML IV SOLN: 10.0000 mg | INTRAVENOUS | Status: DC | PRN

## 2022-12-13 MED ORDER — CLOPIDOGREL BISULFATE 75 MG PO TABS
75.0000 mg | ORAL_TABLET | Freq: Every day | ORAL | Status: DC
  Administered 2022-12-13: 75 mg via ORAL
  Filled 2022-12-13: qty 1

## 2022-12-13 MED ORDER — ATORVASTATIN CALCIUM 40 MG PO TABS: 40.0000 mg | ORAL_TABLET | Freq: Every day | ORAL | 0 refills | Status: AC

## 2022-12-13 MED ORDER — VITAMIN D 25 MCG (1000 UNIT) PO TABS
1000.0000 [IU] | ORAL_TABLET | Freq: Every day | ORAL | Status: DC
  Administered 2022-12-13: 1000 [IU] via ORAL
  Filled 2022-12-13: qty 1

## 2022-12-13 MED ORDER — ASPIRIN 81 MG PO CHEW
81.0000 mg | CHEWABLE_TABLET | Freq: Every day | ORAL | Status: DC
  Administered 2022-12-13: 81 mg via ORAL
  Filled 2022-12-13 (×2): qty 1

## 2022-12-13 NOTE — Progress Notes (Signed)
30 days event monitor ordered per neurology team request, Dr Stanford Breed to read

## 2022-12-13 NOTE — Progress Notes (Signed)
Echocardiogram 2D Echocardiogram has been performed.  Tammy Erickson 12/13/2022, 11:56 AM

## 2022-12-13 NOTE — Discharge Summary (Signed)
Physician Discharge Summary   Patient: Tammy Erickson MRN: [id number] DOB: 1945-11-30  Admit date:     12/12/2022  Discharge date: 12/13/22  Discharge Physician: Karie Kirks   PCP: Lennie Odor, PA   Recommendations at discharge:    Discharge to home. Take study medication as instructed and follow up as instructed. Follow up with neurology as directed. Take aspirin and plavix daily for 21 days. Then take aspirin only. Follow up with PCP in 7-10 days.  Discharge Diagnoses: Principal Problem:   Acute ischemic stroke [facility]) Active Problems:   Hypertensive urgency   Anxiety  Resolved Problems:   * No resolved hospital problems. [facility] Course: The patient presented to [facility] with complaints of disequilibrium and left leg weakness. The symptoms began at 9:00. They had begun to improve at the ED. She was not a candidate for TPA due to time and the mildness of her symptoms. CT of the head was negative there. The patient was discussed with neurologist Dr. Quinn Axe. The recommendation was for admission to [facility] for stroke work up.  She was admitted to a telemetry bed at [facility]. MRI of the brain demonstrated acute infarct in the inferomedial left cerebellum. No intracranial large vessel occlusion or significant stenosis was identified.  MRA of the head demonstrated the same.  Carotid Doppler demonstrated near normal extracranial right and left carotids with only minimal wall thickening or plaque. Bilateral vertebral arteries demonstrated antegrade flow. There were also normal flow hemodynamics in the bilateral subclavian arteries.  Echocardiogram was obtained. It demonstrated no intracardiac thrombus or source of emboli. EF was 60-65% with normal LV function and no regional wall motion abnormalities. The left ventricular diastolic parameters were normal. Right ventricular systolic function was normal with normal size and normal pulmonary artery systolic pressure. The left  atrial size was mildly dilated. Agitated saline contrast bubble study was negative with no evidence of any interatrial shunt.  The patient has been evaluated by PT/OT/ST. She does not need any further therapy. And is deemed safe for discharge to home.  The patient has been evaluated by Dr. Leonie Man. He has enrolled the patient in the [place] study. The patient has received the study medication. She has been cleared by neurology for discharge.  Assessment and Plan:  Acute ischemic stroke [facility]) The patient has suffered a left cerebellar stroke as demonstrated on MRI. There are no large vessel occlusions or evidence of extracranial hemodynamically significant stenosis. Echocardiogram demonstrates no intracardiac thrombus, PFO, or source of embolus. She has been evaluated by PT/OT/ST. She is deemed safe for discharge to home. She has been enrolled in the [place] study by Dr. Leonie Man, and has received the study medication. She is cleared by neurology for discharge to home.  Hyperlipidemia The patient had a LDL of 110 despite her home medication of lipitor 10 mg daily. This was increased to 40 mg daily.  Hypertensive urgency Resolved.      Consultants: neurology Procedures performed: None  Disposition: Home Diet recommendation:  Discharge Diet Orders (From admission, onward)     Start     Ordered   12/13/22 0000  Diet - low sodium heart healthy        12/13/22 1630           Cardiac diet DISCHARGE MEDICATION: Allergies as of 12/13/2022       Reactions   Penicillins Anaphylaxis, Swelling   Codeine Other (See Comments)   "Couldn't lift her head". Weakness    Sulfa  Antibiotics Swelling   Doxycycline Itching, Rash   Motrin [ibuprofen] Itching, Rash        Medication List     STOP taking these medications    COLLAGEN PO   Neuriva Plus Caps   OVER THE COUNTER MEDICATION       TAKE these medications    ALLEGRA ALLERGY PO Take 1 tablet by mouth daily.   aspirin 81 MG  chewable tablet Chew 1 tablet (81 mg total) by mouth daily. Start taking on: December 14, 2022   atorvastatin 40 MG tablet Commonly known as: LIPITOR Take 1 tablet (40 mg total) by mouth daily at 8 pm. What changed:  medication strength how much to take when to take this   citalopram 10 MG tablet Commonly known as: CELEXA Take 10 mg by mouth daily.   clopidogrel 75 MG tablet Commonly known as: PLAVIX Take 1 tablet (75 mg total) by mouth daily for 21 days. Start taking on: December 14, 2022   EYE DROPS OP Place 2 drops into both eyes daily.   Flonase Allergy Relief 50 MCG/ACT nasal spray Generic drug: fluticasone Place 1 spray into both nostrils daily as needed for allergies or rhinitis.   OCEANIC-STROKE asundexian or placebo 50 mg tablet Take 1 tablet (50 mg total) by mouth daily. For Investigational Use Only. Take at the same time each day (preferably in the morning). Tablet should be swallowed with water.   omeprazole 20 MG capsule Commonly known as: PRILOSEC Take 20 mg by mouth daily.   TUMS PO Take 2 tablets by mouth as needed (reflux).   VITAMIN C PO Take 2 tablets by mouth daily. When able to remember   VITAMIN D PO Take 1 capsule by mouth daily.        Discharge Exam: Filed Weights   12/12/22 1518  Weight: 72.6 kg   Exam:  Constitutional:  The patient is awake, alert, and oriented x 3. No acute distress. Eyes:  pupils and irises appear normal Normal lids and conjunctivae Respiratory:  No increased work of breathing. No wheezes, rales, or rhonchi No tactile fremitus Cardiovascular:  Regular rate and rhythm No murmurs, ectopy, or gallups. No lateral PMI. No thrills. Abdomen:  Abdomen is soft, non-tender, non-distended No hernias, masses, or organomegaly Normoactive bowel sounds.  Musculoskeletal:  No cyanosis, clubbing, or edema Skin:  No rashes, lesions, ulcers palpation of skin: no induration or nodules Neurologic:  CN 2-12  intact Sensation all 4 extremities intact Psychiatric:  Mental status Mood, affect appropriate Orientation to person, place, time  judgment and insight appear intact   Condition at discharge: fair  The results of significant diagnostics from this hospitalization (including imaging, microbiology, ancillary and laboratory) are listed below for reference.   Imaging Studies: ECHOCARDIOGRAM COMPLETE  Result Date: 12/13/2022    ECHOCARDIOGRAM REPORT   Patient Name:   JUNELLA KOCSIS Date of Exam: 12/13/2022 Medical Rec #:  [id number]       Height:       63.0 in Accession #:    [id number]      Weight:       160.0 lb Date of Birth:  06-02-1946      BSA:          1.759 m Patient Age:    69 years        BP:           150/90 mmHg Patient Gender: F  HR:           60 bpm. Exam Location:  Inpatient Procedure: 2D Echo, Cardiac Doppler and Color Doppler Indications:    TIA  History:        Patient has no prior history of Echocardiogram examinations.  Sonographer:    Phineas Douglas Referring Phys: [id number] [place]  1. Left ventricular ejection fraction, by estimation, is 60 to 65%. The left ventricle has normal function. The left ventricle has no regional wall motion abnormalities. Left ventricular diastolic parameters were normal.  2. Right ventricular systolic function is normal. The right ventricular size is normal. There is normal pulmonary artery systolic pressure.  3. Left atrial size was mildly dilated.  4. The mitral valve is normal in structure. Trivial mitral valve regurgitation.  5. The aortic valve is tricuspid. There is mild thickening of the aortic valve. Aortic valve regurgitation is not visualized. Aortic valve sclerosis is present, with no evidence of aortic valve stenosis.  6. Aortic dilatation noted. There is borderline dilatation of the ascending aorta, measuring 36 mm.  7. The inferior vena cava is normal in size with <50% respiratory variability, suggesting right  atrial pressure of 8 mmHg.  8. Agitated saline contrast bubble study was negative, with no evidence of any interatrial shunt. Comparison(s): No prior Echocardiogram. FINDINGS  Left Ventricle: Left ventricular ejection fraction, by estimation, is 60 to 65%. The left ventricle has normal function. The left ventricle has no regional wall motion abnormalities. The left ventricular internal cavity size was normal in size. There is  no left ventricular hypertrophy. Left ventricular diastolic parameters were normal. Right Ventricle: The right ventricular size is normal. No increase in right ventricular wall thickness. Right ventricular systolic function is normal. There is normal pulmonary artery systolic pressure. The tricuspid regurgitant velocity is 2.11 m/s, and  with an assumed right atrial pressure of 8 mmHg, the estimated right ventricular systolic pressure is 0000000 mmHg. Left Atrium: Left atrial size was mildly dilated. Right Atrium: Right atrial size was normal in size. Pericardium: There is no evidence of pericardial effusion. Mitral Valve: The mitral valve is normal in structure. Trivial mitral valve regurgitation. Tricuspid Valve: The tricuspid valve is normal in structure. Tricuspid valve regurgitation is trivial. Aortic Valve: The aortic valve is tricuspid. There is mild thickening of the aortic valve. Aortic valve regurgitation is not visualized. Aortic valve sclerosis is present, with no evidence of aortic valve stenosis. Pulmonic Valve: The pulmonic valve was normal in structure. Pulmonic valve regurgitation is trivial. Aorta: Aortic dilatation noted. There is borderline dilatation of the ascending aorta, measuring 36 mm. Venous: The inferior vena cava is normal in size with less than 50% respiratory variability, suggesting right atrial pressure of 8 mmHg. IAS/Shunts: The atrial septum is grossly normal. Agitated saline contrast was given intravenously to evaluate for intracardiac shunting. Agitated saline  contrast bubble study was negative, with no evidence of any interatrial shunt.  LEFT VENTRICLE PLAX 2D LVIDd:         4.30 cm      Diastology LVIDs:         2.50 cm      LV e' medial:    6.40 cm/s LV PW:         1.20 cm      LV E/e' medial:  10.7 LV IVS:        1.20 cm      LV e' lateral:   9.37 cm/s LVOT diam:  1.80 cm      LV E/e' lateral: 7.3 LV SV:         65 LV SV Index:   37 LVOT Area:     2.54 cm  LV Volumes (MOD) LV vol d, MOD A2C: 105.0 ml LV vol d, MOD A4C: 93.4 ml LV vol s, MOD A2C: 36.1 ml LV vol s, MOD A4C: 37.6 ml LV SV MOD A2C:     68.9 ml LV SV MOD A4C:     93.4 ml LV SV MOD BP:      61.5 ml RIGHT VENTRICLE             IVC RV Basal diam:  3.80 cm     IVC diam: 1.90 cm RV S prime:     14.20 cm/s TAPSE (M-mode): 1.8 cm LEFT ATRIUM             Index        RIGHT ATRIUM           Index LA diam:        3.40 cm 1.93 cm/m   RA Area:     14.20 cm LA Vol (A2C):   62.3 ml 35.42 ml/m  RA Volume:   32.90 ml  18.71 ml/m LA Vol (A4C):   42.4 ml 24.11 ml/m LA Biplane Vol: 51.9 ml 29.51 ml/m  AORTIC VALVE             PULMONIC VALVE LVOT Vmax:   108.00 cm/s PR End Diast Vel: 2.87 msec LVOT Vmean:  73.900 cm/s LVOT VTI:    0.254 m  AORTA Ao Root diam: 3.20 cm Ao Asc diam:  3.60 cm MITRAL VALVE               TRICUSPID VALVE MV Area (PHT): 2.48 cm    TR Peak grad:   17.8 mmHg MV Decel Time: 306 msec    TR Vmax:        211.00 cm/s MV E velocity: 68.60 cm/s MV A velocity: 42.80 cm/s  SHUNTS MV E/A ratio:  1.60        Systemic VTI:  0.25 m                            Systemic Diam: 1.80 cm Gwyndolyn Kaufman MD Electronically signed by Gwyndolyn Kaufman MD Signature Date/Time: 12/13/2022/1:21:51 PM    Final    VAS US CAROTID  Result Date: 12/13/2022 Carotid Arterial Duplex Study Patient Name:  SYMPHONI QUANDT  Date of Exam:   12/13/2022 Medical Rec #: [id number]        Accession #:    [id number] Date of Birth: 1946-01-28       Patient Gender: F Patient Age:   22 years Exam Location:  [facility]  Procedure:      VAS US CAROTID Referring Phys: Alferd Patee [facility] - [place] --------------------------------------------------------------------------------  Indications:       CVA. Risk Factors:      Hypertension. Comparison Study:  No prior studies. Performing Technologist: Darlin Coco RDMS, RVT  Examination Guidelines: A complete evaluation includes B-mode imaging, spectral Doppler, color Doppler, and power Doppler as needed of all accessible portions of each vessel. Bilateral testing is considered an integral part of a complete examination. Limited examinations for reoccurring indications may be performed as noted.  Right Carotid Findings: +----------+--------+--------+--------+------------------+------------------+           PSV cm/sEDV cm/sStenosisPlaque DescriptionComments           +----------+--------+--------+--------+------------------+------------------+  CCA Prox  120     24                                                   +----------+--------+--------+--------+------------------+------------------+ CCA Distal66      17                                intimal thickening +----------+--------+--------+--------+------------------+------------------+ ICA Prox  47      13                                                   +----------+--------+--------+--------+------------------+------------------+ ICA Distal72      26                                                   +----------+--------+--------+--------+------------------+------------------+ ECA       83      19                                                   +----------+--------+--------+--------+------------------+------------------+ +----------+--------+-------+----------------+-------------------+           PSV cm/sEDV cmsDescribe        Arm Pressure (mmHG) +----------+--------+-------+----------------+-------------------+ [id number]            Multiphasic, WNL                     +----------+--------+-------+----------------+-------------------+ +---------+--------+--+--------+--+---------+ VertebralPSV cm/s44EDV cm/s13Antegrade +---------+--------+--+--------+--+---------+  Left Carotid Findings: +----------+--------+--------+--------+------------------+------------------+           PSV cm/sEDV cm/sStenosisPlaque DescriptionComments           +----------+--------+--------+--------+------------------+------------------+ CCA Prox  94      19                                                   +----------+--------+--------+--------+------------------+------------------+ CCA Distal63      17                                intimal thickening +----------+--------+--------+--------+------------------+------------------+ ICA Prox  39      14                                                   +----------+--------+--------+--------+------------------+------------------+ ICA Distal65      27                                                   +----------+--------+--------+--------+------------------+------------------+  ECA       83      17                                                   +----------+--------+--------+--------+------------------+------------------+ +----------+--------+--------+----------------+-------------------+           PSV cm/sEDV cm/sDescribe        Arm Pressure (mmHG) +----------+--------+--------+----------------+-------------------+ [id number]             Multiphasic, WNL                    +----------+--------+--------+----------------+-------------------+ +---------+--------+--+--------+--+---------+ VertebralPSV cm/s42EDV cm/s13Antegrade +---------+--------+--+--------+--+---------+   Summary: Right Carotid: The extracranial vessels were near-normal with only minimal wall                thickening or plaque. Left Carotid: The extracranial vessels were near-normal with only minimal wall                thickening or plaque. Vertebrals:  Bilateral vertebral arteries demonstrate antegrade flow. Subclavians: Normal flow hemodynamics were seen in bilateral subclavian              arteries. *See table(s) above for measurements and observations.     Preliminary    MR BRAIN WO CONTRAST  Result Date: 12/13/2022 CLINICAL DATA:  Dizziness and left leg weakness EXAM: MRI HEAD WITHOUT CONTRAST MRA HEAD WITHOUT CONTRAST TECHNIQUE: Multiplanar, multi-echo pulse sequences of the brain and surrounding structures were acquired without intravenous contrast. Angiographic images of the Circle of Willis were acquired using MRA technique without intravenous contrast. COMPARISON:  No prior MRI head available, correlation is made with CT head 12/12/2022 FINDINGS: MRI HEAD FINDINGS Brain: Restricted diffusion with ADC correlate in the inferomedial left cerebellum (series 7, image 43 and series 5, images 53-55). No acute hemorrhage, mass, mass effect, or midline shift. No hemosiderin deposition to suggest remote hemorrhage. No hydrocephalus or extra-axial collection. Cerebral volume is within normal limits for age. Scattered T2 hyperintense signal in the periventricular white matter, likely the sequela of mild chronic small vessel ischemic disease. Vascular: Normal arterial flow voids. Skull and upper cervical spine: Normal marrow signal. Sinuses/Orbits: Mucosal thickening and mucous retention cyst in the left maxillary sinus. Mucosal thickening in the ethmoid air cells. No acute finding in the orbits. Other: The mastoids are well aerated. MRA HEAD FINDINGS Anterior circulation: Both internal carotid arteries are patent to the termini, without significant stenosis. A1 segments patent. Normal anterior communicating artery. Anterior cerebral arteries are patent to their distal aspects. No M1 stenosis or occlusion. Normal MCA bifurcations. Distal MCA branches perfused and symmetric. Posterior circulation: Vertebral arteries patent to the  vertebrobasilar junction without stenosis. Posterior inferior cerebral arteries patent bilaterally. Basilar patent to its distal aspect. Superior cerebellar arteries patent bilaterally. Patent P1 segments. PCAs perfused to their distal aspects without stenosis. Early left PCA branching. The bilateral posterior communicating arteries are patent. Anatomic variants: None significant IMPRESSION: 1. Acute infarct in the inferomedial left cerebellum. 2. No intracranial large vessel occlusion or significant stenosis. These results will be called to the ordering clinician or representative by the Radiologist Assistant, and communication documented in the PACS or [employer]. Electronically Signed   By: Merilyn Baba M.D.   On: 12/13/2022 03:29   MR ANGIO HEAD WO CONTRAST  Result Date: 12/13/2022 CLINICAL DATA:  Dizziness and left leg weakness  EXAM: MRI HEAD WITHOUT CONTRAST MRA HEAD WITHOUT CONTRAST TECHNIQUE: Multiplanar, multi-echo pulse sequences of the brain and surrounding structures were acquired without intravenous contrast. Angiographic images of the Circle of Willis were acquired using MRA technique without intravenous contrast. COMPARISON:  No prior MRI head available, correlation is made with CT head 12/12/2022 FINDINGS: MRI HEAD FINDINGS Brain: Restricted diffusion with ADC correlate in the inferomedial left cerebellum (series 7, image 43 and series 5, images 53-55). No acute hemorrhage, mass, mass effect, or midline shift. No hemosiderin deposition to suggest remote hemorrhage. No hydrocephalus or extra-axial collection. Cerebral volume is within normal limits for age. Scattered T2 hyperintense signal in the periventricular white matter, likely the sequela of mild chronic small vessel ischemic disease. Vascular: Normal arterial flow voids. Skull and upper cervical spine: Normal marrow signal. Sinuses/Orbits: Mucosal thickening and mucous retention cyst in the left maxillary sinus. Mucosal thickening in  the ethmoid air cells. No acute finding in the orbits. Other: The mastoids are well aerated. MRA HEAD FINDINGS Anterior circulation: Both internal carotid arteries are patent to the termini, without significant stenosis. A1 segments patent. Normal anterior communicating artery. Anterior cerebral arteries are patent to their distal aspects. No M1 stenosis or occlusion. Normal MCA bifurcations. Distal MCA branches perfused and symmetric. Posterior circulation: Vertebral arteries patent to the vertebrobasilar junction without stenosis. Posterior inferior cerebral arteries patent bilaterally. Basilar patent to its distal aspect. Superior cerebellar arteries patent bilaterally. Patent P1 segments. PCAs perfused to their distal aspects without stenosis. Early left PCA branching. The bilateral posterior communicating arteries are patent. Anatomic variants: None significant IMPRESSION: 1. Acute infarct in the inferomedial left cerebellum. 2. No intracranial large vessel occlusion or significant stenosis. These results will be called to the ordering clinician or representative by the Radiologist Assistant, and communication documented in the PACS or [employer]. Electronically Signed   By: Merilyn Baba M.D.   On: 12/13/2022 03:29   CT HEAD WO CONTRAST  Result Date: 12/12/2022 CLINICAL DATA:  Neuro deficit, acute, stroke suspected EXAM: CT HEAD WITHOUT CONTRAST TECHNIQUE: Contiguous axial images were obtained from the base of the skull through the vertex without intravenous contrast. RADIATION DOSE REDUCTION: This exam was performed according to the departmental dose-optimization program which includes automated exposure control, adjustment of the mA and/or kV according to patient size and/or use of iterative reconstruction technique. COMPARISON:  None Available. FINDINGS: Brain: No evidence of acute infarction, hemorrhage, hydrocephalus, extra-axial collection or mass lesion/mass effect. Vascular: No hyperdense  vessel identified. Skull: No acute fracture. Sinuses/Orbits: Scattered paranasal sinus mucosal thickening. No acute orbital findings. Other: No mastoid effusions. IMPRESSION: No evidence of acute intracranial abnormality. Electronically Signed   By: Margaretha Sheffield M.D.   On: 12/12/2022 15:46    Microbiology: Results for orders placed or performed in visit on 08/13/19  Novel Coronavirus, NAA ([facility])     Status: None   Collection Time: 08/13/19 12:00 AM   Specimen: Nasopharyngeal(NP) swabs in vial transport medium   NASOPHARYNGE  TESTING  Result Value Ref Range Status   SARS-CoV-2, NAA Not Detected Not Detected Final    Comment: This nucleic acid amplification test was developed and its performance characteristics determined by [employer]. Nucleic acid amplification tests include PCR and TMA. This test has not been FDA cleared or approved. This test has been authorized by FDA under an Emergency Use Authorization (EUA). This test is only authorized for the duration of time the declaration that circumstances exist justifying the authorization of the emergency use of  in vitro diagnostic tests for detection of SARS-CoV-2 virus and/or diagnosis of COVID-19 infection under section 564(b)(1) of the Act, 21 U.S.C. [id number]) (1), unless the authorization is terminated or revoked sooner. When diagnostic testing is negative, the possibility of a false negative result should be considered in the context of a patient's recent exposures and the presence of clinical signs and symptoms consistent with COVID-19. An individual without symptoms of COVID-19 and who is not shedding SARS-CoV-2 virus would  expect to have a negative (not detected) result in this assay.     Labs: CBC: Recent Labs  Lab 12/12/22 1522 12/13/22 0332  WBC 8.1 5.7  NEUTROABS 3.6  --   HGB 12.6 12.3  HCT 37.0 34.5*  MCV 90.2 88.9  PLT 239 [id number]   Basic Metabolic Panel: Recent Labs  Lab 12/12/22 1522  12/13/22 0332  NA 137 140  K 3.7 3.6  CL 105 107  CO2 27 23  GLUCOSE 108* 99  BUN 26* 21  CREATININE 0.80 0.71  CALCIUM 8.8* 8.7*   Liver Function Tests: Recent Labs  Lab 12/12/22 1522  AST 22  ALT 18  ALKPHOS 71  BILITOT 0.6  PROT 7.1  ALBUMIN 4.0   CBG: Recent Labs  Lab 12/12/22 1533  GLUCAP 120*    Discharge time spent: greater than 30 minutes.  Signed: Karie Kirks, DO [facility] 12/13/2022

## 2022-12-13 NOTE — Evaluation (Signed)
Physical Therapy Evaluation Patient Details Name: Tammy Erickson MRN: [id number] DOB: 03-Mar-1946 Today's Date: 12/13/2022  History of Present Illness  Pt is a 77 y/o female presenting with LLE weakness and balance deficits. MRI brain showed L cerebellar infarct. PMH: anxiety, HLD, GERD  Clinical Impression  Pt presents today with impaired balance and functional mobility. Pt is independent with all mobility at baseline, currently requiring minG for ambulation due to path deviation and staggering gait. Pt able to self-correct LOB but noted to stagger both left and right when ambulating, especially with vertical and horizontal head turns or higher level balance activities. Pt will continue to benefit from skilled acute PT at this time to progress balance and safety with mobility, recommend OPPT upon discharge to continue to progress balance back to baseline. PT will continue to follow during this admission as appropriate.        Recommendations for follow up therapy are one component of a multi-disciplinary discharge planning process, led by the attending physician.  Recommendations may be updated based on patient status, additional functional criteria and insurance authorization.  Follow Up Recommendations Outpatient PT      Assistance Recommended at Discharge Intermittent Supervision/Assistance  Patient can return home with the following  A little help with walking and/or transfers;Assist for transportation;Help with stairs or ramp for entrance    Equipment Recommendations None recommended by PT  Recommendations for Other Services       Functional Status Assessment Patient has had a recent decline in their functional status and demonstrates the ability to make significant improvements in function in a reasonable and predictable amount of time.     Precautions / Restrictions Precautions Precautions: Fall Restrictions Weight Bearing Restrictions: No      Mobility  Bed  Mobility Overal bed mobility: Modified Independent             General bed mobility comments: use of side rail with HOB elevated but no assist required    Transfers Overall transfer level: Independent Equipment used: None               General transfer comment: stable and no use of AD or assist for transfers    Ambulation/Gait Ambulation/Gait assistance: Min guard Gait Distance (Feet): 200 Feet Assistive device: None Gait Pattern/deviations: Step-through pattern, Drifts right/left Gait velocity: WFL     General Gait Details: increased path sway noted with ambulation, especially with higher level balance activities (head turns, turning 180*) and intermittently during ambulation but pt self-correcting  Stairs Stairs: Yes Stairs assistance: Min guard Stair Management: One rail Right, Alternating pattern, Forwards Number of Stairs: 11 General stair comments: cued for proper rail use to replicate home as well as decreasing speed due to mild unsteadiness  Wheelchair Mobility    Modified Rankin (Stroke Patients Only) Modified Rankin (Stroke Patients Only) Pre-Morbid Rankin Score: No symptoms Modified Rankin: Slight disability     Balance Overall balance assessment: Needs assistance Sitting-balance support: No upper extremity supported, Feet supported Sitting balance-Leahy Scale: Normal Sitting balance - Comments: static and dynamic sitting balance stable   Standing balance support: No upper extremity supported, During functional activity Standing balance-Leahy Scale: Fair Standing balance comment: stable balance with standing but self-correcting LOB noted with gait and higher level gait activities                             Pertinent Vitals/Pain Pain Assessment Pain Assessment: No/denies pain    Home  Living Family/patient expects to be discharged to:: Private residence Living Arrangements: Children Available Help at Discharge: Family;Available  PRN/intermittently Type of Home: House Home Access: Stairs to enter Entrance Stairs-Rails: Right Entrance Stairs-Number of Steps: 5 Alternate Level Stairs-Number of Steps: 1 flight Home Layout: Two level;Bed/bath upstairs Home Equipment: None      Prior Function Prior Level of Function : Independent/Modified Independent;Driving             Mobility Comments: pt independent with mobility without use of an AD, drives at baseline       Hand Dominance   Dominant Hand: Right    Extremity/Trunk Assessment   Upper Extremity Assessment Upper Extremity Assessment: Defer to OT evaluation    Lower Extremity Assessment Lower Extremity Assessment: Overall WFL for tasks assessed (BLE grossly WFL, no sensation or coordination impairments noted)    Cervical / Trunk Assessment Cervical / Trunk Assessment: Normal  Communication   Communication: No difficulties  Cognition Arousal/Alertness: Awake/alert Behavior During Therapy: WFL for tasks assessed/performed Overall Cognitive Status: Within Functional Limits for tasks assessed                                 General Comments: A&Ox4, pleasant throughout        General Comments General comments (skin integrity, edema, etc.): VSS on room air    Exercises     Assessment/Plan    PT Assessment Patient needs continued PT services  PT Problem List Decreased balance;Decreased mobility;Decreased safety awareness       PT Treatment Interventions DME instruction;Gait training;Stair training;Functional mobility training;Therapeutic activities;Therapeutic exercise;Balance training;Neuromuscular re-education;Patient/family education    PT Goals (Current goals can be found in the Care Plan section)  Acute Rehab PT Goals Patient Stated Goal: get better and go home PT Goal Formulation: With patient Time For Goal Achievement: 12/27/22 Potential to Achieve Goals: Good    Frequency Min 3X/week     Co-evaluation                AM-PAC PT "6 Clicks" Mobility  Outcome Measure Help needed turning from your back to your side while in a flat bed without using bedrails?: None Help needed moving from lying on your back to sitting on the side of a flat bed without using bedrails?: None Help needed moving to and from a bed to a chair (including a wheelchair)?: None Help needed standing up from a chair using your arms (e.g., wheelchair or bedside chair)?: None Help needed to walk in hospital room?: A Little Help needed climbing 3-5 steps with a railing? : A Little 6 Click Score: 22    End of Session   Activity Tolerance: Patient tolerated treatment well Patient left: in bed;with call bell/phone within reach Nurse Communication: Mobility status PT Visit Diagnosis: Unsteadiness on feet (R26.81);Difficulty in walking, not elsewhere classified (R26.2)    Time: [id number] PT Time Calculation (min) (ACUTE ONLY): 12 min   Charges:   PT Evaluation $PT Eval Low Complexity: 1 Low          Charlynne Cousins, PT DPT Acute Rehabilitation Services Office [phone]   Luvenia Heller 12/13/2022, 1:06 PM

## 2022-12-13 NOTE — Assessment & Plan Note (Signed)
The patient has suffered a left cerebellar stroke as demonstrated on MRI. There are no large vessel occlusions or evidence of extracranial hemodynamically significant stenosis. Echocardiogram demonstrates no intracardiac thrombus, PFO, or source of embolus. She has been evaluated by PT/OT/ST. She is deemed safe for discharge to home. She has been enrolled in the [place] study by Dr. Leonie Man, and has received the study medication. She is cleared by neurology for discharge to home.

## 2022-12-13 NOTE — Consult Note (Signed)
NEUROLOGY CONSULTATION NOTE   Date of service: December 13, 2022 Patient Name: Tammy Erickson MRN:  [id number] DOB:  13-Oct-1946 Reason for consult: "left leg weakness" Requesting Provider: Vianne Bulls, MD _ _ _   _ __   _ __ _ _  __ __   _ __   __ _  History of Present Illness  Tammy Erickson is a 77 y.o. female with PMH significant for anxiety, HLD, GERD who presented with left leg incoordination and veering off to the left.  She woke up in AM around 0730. Around 0900, leaned to the left on the door when she bent down. Noticed that she would veer off to the left when walking. Did not think much about it. No pain. Reported mild headache at that time but it is not uncommon for her to have a headache. Intermittently, throughout the day, she noticed that her left leg was more like a "noodle". She went to see her friends in [place] and when she got there and walked out of her car, left leg was really hard to coordinate. She was dragging her left leg. At the advice of her friend, eventually came in to get checked out.  No prior hx of strokes, no family hx of strokes. No hx of DM2, no hx of HTN. Reports HLD and takes cholesterol med daily. No prior or current hx of smoking. Drinks a small glass of wine or 2 every evening but has stopped recently. Does not use any recreational drugs. Reports that heart rate jumps and some times feels her heart running in her chest.  LKW: 0900 on 12/12/22 mRS: 0 tNKASE: not offered, outside window and no deficit Thrombectomy: not offered, no deficit. NIHSS components Score: Comment  1a Level of Conscious 0[x]$  1[]$  2[]$  3[]$      1b LOC Questions 0[x]$  1[]$  2[]$       1c LOC Commands 0[x]$  1[]$  2[]$       2 Best Gaze 0[x]$  1[]$  2[]$       3 Visual 0[x]$  1[]$  2[]$  3[]$      4 Facial Palsy 0[x]$  1[]$  2[]$  3[]$      5a Motor Arm - left 0[x]$  1[]$  2[]$  3[]$  4[]$  UN[]$    5b Motor Arm - Right 0[x]$  1[]$  2[]$  3[]$  4[]$  UN[]$    6a Motor Leg - Left 0[x]$  1[]$  2[]$  3[]$  4[]$  UN[]$    6b Motor Leg - Right 0[x]$   1[]$  2[]$  3[]$  4[]$  UN[]$    7 Limb Ataxia 0[x]$  1[]$  2[]$  3[]$  UN[]$     8 Sensory 0[x]$  1[]$  2[]$  UN[]$      9 Best Language 0[x]$  1[]$  2[]$  3[]$      10 Dysarthria 0[x]$  1[]$  2[]$  UN[]$      11 Extinct. and Inattention 0[x]$  1[]$  2[]$       TOTAL: 0      ROS   Constitutional Denies weight loss, fever and chills.   HEENT Denies changes in vision and hearing.   Respiratory Denies SOB and cough.   CV Denies palpitations and CP   GI Denies abdominal pain, nausea, vomiting and diarrhea.   GU Denies dysuria and urinary frequency.   MSK Denies myalgia and joint pain.   Skin Denies rash and pruritus.   Neurological Denies headache and syncope.   Psychiatric Denies recent changes in mood. Denies anxiety and depression.    Past History  History reviewed. No pertinent past medical history. Past Surgical History:  Procedure Laterality Date   BREAST SURGERY     History reviewed. No pertinent family history.  Social History   Socioeconomic History   Marital status: Married    Spouse name: Not on file   Number of children: Not on file   Years of education: Not on file   Highest education level: Not on file  Occupational History   Not on file  Tobacco Use   Smoking status: Never   Smokeless tobacco: Never  Substance and Sexual Activity   Alcohol use: Yes   Drug use: Not on file   Sexual activity: Not on file  Other Topics Concern   Not on file  Social History Narrative   Not on file   Social Determinants of Health   Financial Resource Strain: Not on file  Food Insecurity: Not on file  Transportation Needs: Not on file  Physical Activity: Not on file  Stress: Not on file  Social Connections: Not on file   Allergies  Allergen Reactions   Codeine    Doxycycline    Motrin [Ibuprofen]    Penicillins    Sulfa Antibiotics     Medications   Medications Prior to Admission  Medication Sig Dispense Refill Last Dose   citalopram (CELEXA) 10 MG tablet Take 10 mg by mouth daily.  0     HYDROcodone-acetaminophen (NORCO/VICODIN) 5-325 MG tablet Take 1 tablet by mouth every 6 (six) hours as needed. 30 tablet 0      Vitals   Vitals:   12/12/22 1726 12/12/22 1730 12/12/22 1841 12/12/22 1848  BP: (!) 195/106 (!) 180/99 (!) 157/94   Pulse: (!) 59 (!) 59  (!) 58  Resp: (!) 9 13  12  $ Temp:      TempSrc:      SpO2: 100% 99%  98%  Weight:      Height:         Body mass index is 28.34 kg/m.  Physical Exam   General: Laying comfortably in bed; in no acute distress.  HENT: Normal oropharynx and mucosa. Normal external appearance of ears and nose.  Neck: Supple, no pain or tenderness  CV: No JVD. No peripheral edema.  Pulmonary: Symmetric Chest rise. Normal respiratory effort.  Abdomen: Soft to touch, non-tender.  Ext: No cyanosis, edema, or deformity  Skin: No rash. Normal palpation of skin.   Musculoskeletal: Normal digits and nails by inspection. No clubbing.   Neurologic Examination  Mental status/Cognition: Alert, oriented to self, place, month and year, good attention.  Speech/language: Fluent, comprehension intact, object naming intact, repetition intact.  Cranial nerves:   CN II Pupils equal and reactive to light, no VF deficits    CN III,IV,VI EOM intact, no gaze preference or deviation, no nystagmus    CN V normal sensation in V1, V2, and V3 segments bilaterally    CN VII no asymmetry, no nasolabial fold flattening    CN VIII normal hearing to speech    CN IX & X normal palatal elevation, no uvular deviation    CN XI 5/5 head turn and 5/5 shoulder shrug bilaterally    CN XII midline tongue protrusion    Motor:  Muscle bulk: normal, tone normal, pronator drift none tremor none Mvmt Root Nerve  Muscle Right Left Comments  SA C5/6 Ax Deltoid 5 5   EF C5/6 Mc Biceps 5 5   EE C6/7/8 Rad Triceps 5 5   WF C6/7 Med FCR     WE C7/8 PIN ECU     F Ab C8/T1 U ADM/FDI 5 5   HF L1/2/3 Fem Illopsoas 5  5   KE L2/3/4 Fem Quad 5 5   DF L4/5 D Peron Tib Ant 5 5    PF S1/2 Tibial Grc/Sol 5 5    Sensation:  Light touch Intact throughout   Pin prick    Temperature    Vibration   Proprioception    Coordination/Complex Motor:  - Finger to Nose intact BL - Heel to shin intact BL - Rapid alternating movement are normal - Gait: deferred.  Labs   CBC:  Recent Labs  Lab 12/12/22 1522  WBC 8.1  NEUTROABS 3.6  HGB 12.6  HCT 37.0  MCV 90.2  PLT [id number]    Basic Metabolic Panel:  Lab Results  Component Value Date   NA 137 12/12/2022   K 3.7 12/12/2022   CO2 27 12/12/2022   GLUCOSE 108 (H) 12/12/2022   BUN 26 (H) 12/12/2022   CREATININE 0.80 12/12/2022   CALCIUM 8.8 (L) 12/12/2022   GFRNONAA >60 12/12/2022   Lipid Panel: No results found for: "LDLCALC" HgbA1c: No results found for: "HGBA1C" Urine Drug Screen: No results found for: "LABOPIA", "COCAINSCRNUR", "LABBENZ", "AMPHETMU", "THCU", "LABBARB"  Alcohol Level     Component Value Date/Time   ETH <10 12/12/2022 1522    CT Head without contrast(Personally reviewed): CTH was negative for a large hypodensity concerning for a large territory infarct or hyperdensity concerning for an ICH  MR Angio head without contrast and Carotid Duplex BL: pending  MRI Brain: pending  Impression   Tammy Erickson is a 77 y.o. female with PMH significant for anxiety, HLD, GERD who presented with left leg incoordination and veering off to the left. Symptoms resolved several hours later. Her neurologic examination is notable for no focal deficit.  Had some headache around the time of episode but it is very typical for her to have a headache and denies prior hx of focal deficit in the setting of headache.  Suspect this was a TIA/minor ischemic stroke.  Recommendations  - Frequent Neuro checks per stroke unit protocol - Recommend brain imaging with MRI Brain without contrast - Recommend Vascular imaging with MRA Angio Head without contrast and US Carotid doppler. Declined CTA as she is worried  about contrast causing allergies. - Recommend obtaining TTE - Recommend obtaining Lipid panel with LDL - Please start statin if LDL > 70 - Recommend HbA1c to evaluate for diabetes and how well it is controlled. - Antithrombotic - Aspirin 39m daily along with plavix 718mdaily x 21 days, followed by Aspirin 8159maily alone. - Recommend DVT ppx - SBP goal - permissive hypertension first 24 h < 220/110. Held home meds.  - Recommend Telemetry monitoring for arrythmia - Recommend bedside swallow screen prior to PO intake. - Stroke education booklet - Recommend PT/OT/SLP consult ______________________________________________________________________  Thank you for the opportunity to take part in the care of this patient. If you have any further questions, please contact the neurology consultation attending.  Signed,  SalFordlandger Number [id number]_ _   _ __   _ __ _ _  __ __   _ __   __ _

## 2022-12-13 NOTE — Progress Notes (Signed)
Patient off floor to echo and us/carotid.

## 2022-12-13 NOTE — Assessment & Plan Note (Signed)
Resolved

## 2022-12-13 NOTE — Progress Notes (Signed)
[facility] New Study Start: OCEANIC-STROKE   SUMMARY For more information refer to: [email or web address]. Study Identifier: [id number]    A Multicenter, International, Randomized, Placebo Controlled, Double-blind, Parallel Group and Event Driven Phase 3 Study of the Oral FXIa Inhibitor Asundexian ([place] [id number]) for the Prevention of Ischemic Stroke in Female and Female Participants Aged 75 Years and Older After an Acute Non-cardioembolic Ischemic Stroke or High-risk TIA  Brief Summary This study will randomize adult patients with an initial diagnosis of acute non-cardioembolic ischemic stroke or high-risk transient ischemic attack (TIA) to treatment within 72 hours of symptom onset and with the intention to be treated with antiplatelet therapy.  Design Phase 3, Randomized, Interventional, Event-Driven  Intervention Asundexian [id number]) 50 mg tablets or matching placebo tablets (pink, oval, film-coated tablets  Concomitant Therapy Participants are to receive antiplatelet therapy (single or dual; provider discretion) during the study conduct.  Prohibited Therapy Oral anticoagulation Full dose and/or long-term anticoagulation therapy with heparin or LMWH  Chronic (more than 4 weeks continuous) therapy with NSAIDs during the study conduct Concomitant use of combined P-gp and strong/moderate CYP3A4 inducers Concomitant use of combined P-gp and strong CYP3A4 inhibitors Herbal/traditional medications and/or supplements with known anticoagulant/antiplatelet effects  Anticoagulation Prophylaxis Venous thromboembolism prophylaxis with LMWH or unfractionated heparin for short periods of time (2 weeks) is allowed.  Potential Drug-Drug Interactions Rosuvastatin 20 mg daily or higher or atorvastatin 80 mg (additional monitoring may be warranted due to increased concentrations of rosuvastatin and atorvastatin - asundexian [id number]) is a weak inhibitor of CYP2C8)  Administration   Can be taken irrespective of food intake. Should be swallowed whole with water, preferably in the morning. CANNOT be crushed or broken.      Plan: Start Tammy Erickson [phone]) 50 mg tablets or placebo] today (12/13/22). Study medication must be picked up from pharmacy, medication can not be tubed.    Please contact [facility] if any questions or concerns regarding the study medication.    Acey Lav, PharmD, [facility] Pharmacist  [phone]

## 2022-12-13 NOTE — Progress Notes (Signed)
OCEANIC STROKE RESEARCH STUDY NOTE   Patient is participating in [place]   stroke prevention study ( standard of care antiplatelet therapy plus Asundexian-factor 11 inhibitor versus standard of care antiplatelet therapy plus placebo ).  Patient was given study material to review and informed consent form at 11:30 AM.  Patient was advised to take as much time as she wanted to review the consent form and encouraged to ask questions which were answered.  Patient's son and sister and daughter were also present at the bedside and were also encouraged to review the material and ask questions.  It was made clear to the patient that study participation is voluntary and patient will still get the same excellent standard of care irrespective of whether she chooses to participate in the study or not.  The benefits of participation in the study as well as the risk involved including but not limited to bleeding as well as alternatives to not participating in the study were clearly discussed with the patient and family who expressed understanding.  The patient was clearly informed that patient has no obligation to's stay in the study for the entire duration and is free to discontinue study medication or study participation if she is dissatisfied at any time in the future.  The research study office visit scheduled was explained to the patient and study obligations were clearly explained as well.  Patient voiced understanding and willingness to participate in the study.  The study inclusion exclusion criteria reviewed by me personally as well as study coordinator and verified that patient met all criteria.  Patient signed informed consent with study coordinator in my presence and 1442 PM.  The hospital research pharmacy was notified in advance about potential patient participation and after patient randomization and patient received first dose of medication before discharge and she was discharged home with the study medication  bottle and instructed to call [place] research office with any questions.  No study specific procedure was done prior to patient signing informed consent form.  A copy of informed consent form and patient [place] of Rights signed by the patient was given to her.  Patient's medical hospitalist physician Dr.Swayze was informed about study participation. Antony Contras, MD

## 2022-12-13 NOTE — Progress Notes (Signed)
Carotid duplex bilateral study completed.   Please see CV Proc for preliminary results.   Kaylon Laroche, RDMS, RVT  

## 2022-12-13 NOTE — Plan of Care (Signed)
Patient discharging home to care for self. New meds given to patient and discharge teaching completed.

## 2022-12-13 NOTE — Evaluation (Signed)
Occupational Therapy Evaluation Patient Details Name: Tammy Erickson MRN: [id number] DOB: 15-Mar-1946 Today's Date: 12/13/2022   History of Present Illness Pt is a 77 y/o female presenting with LLE weakness and balance deficits. MRI brain showed L cerebellar infarct. PMH: anxiety, HLD, GERD   Clinical Impression   PTA, pt lives with son and typically completely Independent with all daily tasks, often walking her dog > 2 miles. Pt presents now with LLE reportedly much improved from initial symptom onset. However, minor dynamic standing balance deficits remain though pt often able to self correct. Pt requires no more than Supervision for LB ADLs and in-room mobility. Emphasized safety w/ stair mgmt at home and easing back into daily routine slowly to decrease fall risk. Anticipate no OT needs at DC but will follow acutely to progress balance during daily tasks.       Recommendations for follow up therapy are one component of a multi-disciplinary discharge planning process, led by the attending physician.  Recommendations may be updated based on patient status, additional functional criteria and insurance authorization.   Follow Up Recommendations  No OT follow up     Assistance Recommended at Discharge PRN  Patient can return home with the following      Functional Status Assessment  Patient has had a recent decline in their functional status and demonstrates the ability to make significant improvements in function in a reasonable and predictable amount of time.  Equipment Recommendations  None recommended by OT    Recommendations for Other Services       Precautions / Restrictions Precautions Precautions: Fall Restrictions Weight Bearing Restrictions: No      Mobility Bed Mobility Overal bed mobility: Modified Independent                  Transfers Overall transfer level: Independent Equipment used: None                      Balance Overall balance  assessment: Mild deficits observed, not formally tested                                         ADL either performed or assessed with clinical judgement   ADL Overall ADL's : Needs assistance/impaired Eating/Feeding: Independent   Grooming: Modified independent;Standing   Upper Body Bathing: Modified independent   Lower Body Bathing: Supervison/ safety   Upper Body Dressing : Modified independent   Lower Body Dressing: Supervision/safety Lower Body Dressing Details (indicate cue type and reason): to don socks EOB, increased time for LLE Toilet Transfer: Supervision/safety;Ambulation Toilet Transfer Details (indicate cue type and reason): mild LOB/balance deficits but able to correct Toileting- Clothing Manipulation and Hygiene: Modified independent       Functional mobility during ADLs: Supervision/safety General ADL Comments: higher level balance deficits     Vision Ability to See in Adequate Light: 0 Adequate Patient Visual Report: No change from baseline Vision Assessment?: No apparent visual deficits Additional Comments: reports initial vision issues/room spinning before coming to hospital but back to baseline now     Perception     Praxis      Pertinent Vitals/Pain Pain Assessment Pain Assessment: No/denies pain     Hand Dominance Right   Extremity/Trunk Assessment Upper Extremity Assessment Upper Extremity Assessment: Overall WFL for tasks assessed   Lower Extremity Assessment Lower Extremity Assessment: Defer to PT evaluation  Cervical / Trunk Assessment Cervical / Trunk Assessment: Normal   Communication Communication Communication: No difficulties (reports voice deeper and more raspy than normal)   Cognition Arousal/Alertness: Awake/alert Behavior During Therapy: WFL for tasks assessed/performed Overall Cognitive Status: Within Functional Limits for tasks assessed                                       General  Comments  BP 153/86 post activity    Exercises     Shoulder Instructions      Home Living Family/patient expects to be discharged to:: Private residence Living Arrangements: Children (son) Available Help at Discharge: Family;Available PRN/intermittently (son works) Type of Home: House Home Access: Stairs to enter Technical brewer of Steps: 5-6 Entrance Stairs-Rails: Right Home Layout: Two level;Bed/bath upstairs Alternate Level Stairs-Number of Steps: flight Alternate Level Stairs-Rails: Right Bathroom Shower/Tub: Occupational psychologist: Handicapped height     Home Equipment: None          Prior Functioning/Environment Prior Level of Function : Independent/Modified Independent;Driving             Mobility Comments: no AD, often walks 2.5 miles with sister to walk their dogs ADLs Comments: Drives, active in church community, book club, etc        OT Problem List: Impaired balance (sitting and/or standing);Decreased coordination      OT Treatment/Interventions: Self-care/ADL training;Therapeutic exercise;Therapeutic activities    OT Goals(Current goals can be found in the care plan section) Acute Rehab OT Goals Patient Stated Goal: return back to full independence OT Goal Formulation: With patient Time For Goal Achievement: 12/27/22 Potential to Achieve Goals: Good  OT Frequency: Min 2X/week    Co-evaluation              AM-PAC OT "6 Clicks" Daily Activity     Outcome Measure Help from another person eating meals?: None Help from another person taking care of personal grooming?: None Help from another person toileting, which includes using toliet, bedpan, or urinal?: A Little Help from another person bathing (including washing, rinsing, drying)?: A Little Help from another person to put on and taking off regular upper body clothing?: None Help from another person to put on and taking off regular lower body clothing?: A Little 6 Click  Score: 21   End of Session Equipment Utilized During Treatment: Gait belt Nurse Communication: Mobility status  Activity Tolerance: Patient tolerated treatment well Patient left: in bed;with call bell/phone within reach;with nursing/sitter in room  OT Visit Diagnosis: Unsteadiness on feet (R26.81)                [id number] OT Time Calculation (min): 17 min Charges:  OT General Charges $OT Visit: 1 Visit OT Evaluation $OT Eval Low Complexity: 1 Low  Malachy Chamber, OTR/L Acute Rehab Services Office: [phone]   Layla Maw 12/13/2022, 7:52 AM

## 2022-12-13 NOTE — Progress Notes (Signed)
SLP Cancellation Note  Patient Details Name: Tammy Erickson MRN: [id number] DOB: 29-Aug-1946   Cancelled treatment:       Reason Eval/Treat Not Completed: SLP screened, no needs identified, will sign off   Sonia Baller, MA, CCC-SLP Speech Therapy

## 2022-12-13 NOTE — Assessment & Plan Note (Signed)
The patient had a LDL of 110 despite her home medication of lipitor 10 mg daily. This was increased to 40 mg daily.

## 2022-12-13 NOTE — Progress Notes (Addendum)
STROKE TEAM PROGRESS NOTE   INTERVAL HISTORY Patient is seen in the room with her sister and her son at the bedside.  Yesterday, she noticed left leg incoordination, weakness and leaning to the left while ambulating.  She was found to have a small left cerebellar stroke on MRI.  Vitals:   12/13/22 0355 12/13/22 0742 12/13/22 0800 12/13/22 1100  BP: (!) 162/67 (!) 153/86 (!) 150/91   Pulse: 65 65  62  Resp: 16 18  15  $ Temp: 98.6 F (37 C) 98.5 F (36.9 C)    TempSrc: Oral Oral Oral   SpO2: 97% 93%  91%  Weight:      Height:       CBC:  Recent Labs  Lab 12/12/22 1522 12/13/22 0332  WBC 8.1 5.7  NEUTROABS 3.6  --   HGB 12.6 12.3  HCT 37.0 34.5*  MCV 90.2 88.9  PLT 239 [id number]   Basic Metabolic Panel:  Recent Labs  Lab 12/12/22 1522 12/13/22 0332  NA 137 140  K 3.7 3.6  CL 105 107  CO2 27 23  GLUCOSE 108* 99  BUN 26* 21  CREATININE 0.80 0.71  CALCIUM 8.8* 8.7*   Lipid Panel:  Recent Labs  Lab 12/13/22 0332  CHOL 181  TRIG 45  HDL 62  CHOLHDL 2.9  VLDL 9  LDLCALC 110*   HgbA1c:  Recent Labs  Lab 12/13/22 0332  HGBA1C 5.2   Urine Drug Screen: No results for input(s): "LABOPIA", "COCAINSCRNUR", "LABBENZ", "AMPHETMU", "THCU", "LABBARB" in the last 168 hours.  Alcohol Level  Recent Labs  Lab 12/12/22 1522  ETH <10    IMAGING past 24 hours VAS US CAROTID  Result Date: 12/13/2022 Carotid Arterial Duplex Study Patient Name:  Tammy Erickson  Date of Exam:   12/13/2022 Medical Rec #: [id number]        Accession #:    [id number] Date of Birth: Oct 30, 1946       Patient Gender: F Patient Age:   77 years Exam Location:  [facility] Procedure:      VAS US CAROTID Referring Phys: Alferd Patee [facility] --------------------------------------------------------------------------------  Indications:       CVA. Risk Factors:      Hypertension. Comparison Study:  No prior studies. Performing Technologist: Darlin Coco RDMS, RVT  Examination Guidelines: A complete  evaluation includes B-mode imaging, spectral Doppler, color Doppler, and power Doppler as needed of all accessible portions of each vessel. Bilateral testing is considered an integral part of a complete examination. Limited examinations for reoccurring indications may be performed as noted.  Right Carotid Findings: +----------+--------+--------+--------+------------------+------------------+           PSV cm/sEDV cm/sStenosisPlaque DescriptionComments           +----------+--------+--------+--------+------------------+------------------+ CCA Prox  120     24                                                   +----------+--------+--------+--------+------------------+------------------+ CCA Distal66      17                                intimal thickening +----------+--------+--------+--------+------------------+------------------+ ICA Prox  47      13                                                   +----------+--------+--------+--------+------------------+------------------+  ICA Distal72      26                                                   +----------+--------+--------+--------+------------------+------------------+ ECA       83      19                                                   +----------+--------+--------+--------+------------------+------------------+ +----------+--------+-------+----------------+-------------------+           PSV cm/sEDV cmsDescribe        Arm Pressure (mmHG) +----------+--------+-------+----------------+-------------------+ [id number]            Multiphasic, WNL                    +----------+--------+-------+----------------+-------------------+ +---------+--------+--+--------+--+---------+ VertebralPSV cm/s44EDV cm/s13Antegrade +---------+--------+--+--------+--+---------+  Left Carotid Findings: +----------+--------+--------+--------+------------------+------------------+           PSV cm/sEDV  cm/sStenosisPlaque DescriptionComments           +----------+--------+--------+--------+------------------+------------------+ CCA Prox  94      19                                                   +----------+--------+--------+--------+------------------+------------------+ CCA Distal63      17                                intimal thickening +----------+--------+--------+--------+------------------+------------------+ ICA Prox  39      14                                                   +----------+--------+--------+--------+------------------+------------------+ ICA Distal65      27                                                   +----------+--------+--------+--------+------------------+------------------+ ECA       83      17                                                   +----------+--------+--------+--------+------------------+------------------+ +----------+--------+--------+----------------+-------------------+           PSV cm/sEDV cm/sDescribe        Arm Pressure (mmHG) +----------+--------+--------+----------------+-------------------+ [id number]             Multiphasic, WNL                    +----------+--------+--------+----------------+-------------------+ +---------+--------+--+--------+--+---------+ VertebralPSV cm/s42EDV cm/s13Antegrade +---------+--------+--+--------+--+---------+   Summary: Right Carotid: The extracranial vessels were near-normal with only minimal wall  thickening or plaque. Left Carotid: The extracranial vessels were near-normal with only minimal wall               thickening or plaque. Vertebrals:  Bilateral vertebral arteries demonstrate antegrade flow. Subclavians: Normal flow hemodynamics were seen in bilateral subclavian              arteries. *See table(s) above for measurements and observations.     Preliminary    MR BRAIN WO CONTRAST  Result Date: 12/13/2022 CLINICAL DATA:  Dizziness  and left leg weakness EXAM: MRI HEAD WITHOUT CONTRAST MRA HEAD WITHOUT CONTRAST TECHNIQUE: Multiplanar, multi-echo pulse sequences of the brain and surrounding structures were acquired without intravenous contrast. Angiographic images of the Circle of Willis were acquired using MRA technique without intravenous contrast. COMPARISON:  No prior MRI head available, correlation is made with CT head 12/12/2022 FINDINGS: MRI HEAD FINDINGS Brain: Restricted diffusion with ADC correlate in the inferomedial left cerebellum (series 7, image 43 and series 5, images 53-55). No acute hemorrhage, mass, mass effect, or midline shift. No hemosiderin deposition to suggest remote hemorrhage. No hydrocephalus or extra-axial collection. Cerebral volume is within normal limits for age. Scattered T2 hyperintense signal in the periventricular white matter, likely the sequela of mild chronic small vessel ischemic disease. Vascular: Normal arterial flow voids. Skull and upper cervical spine: Normal marrow signal. Sinuses/Orbits: Mucosal thickening and mucous retention cyst in the left maxillary sinus. Mucosal thickening in the ethmoid air cells. No acute finding in the orbits. Other: The mastoids are well aerated. MRA HEAD FINDINGS Anterior circulation: Both internal carotid arteries are patent to the termini, without significant stenosis. A1 segments patent. Normal anterior communicating artery. Anterior cerebral arteries are patent to their distal aspects. No M1 stenosis or occlusion. Normal MCA bifurcations. Distal MCA branches perfused and symmetric. Posterior circulation: Vertebral arteries patent to the vertebrobasilar junction without stenosis. Posterior inferior cerebral arteries patent bilaterally. Basilar patent to its distal aspect. Superior cerebellar arteries patent bilaterally. Patent P1 segments. PCAs perfused to their distal aspects without stenosis. Early left PCA branching. The bilateral posterior communicating arteries  are patent. Anatomic variants: None significant IMPRESSION: 1. Acute infarct in the inferomedial left cerebellum. 2. No intracranial large vessel occlusion or significant stenosis. These results will be called to the ordering clinician or representative by the Radiologist Assistant, and communication documented in the PACS or [employer]. Electronically Signed   By: Merilyn Baba M.D.   On: 12/13/2022 03:29   MR ANGIO HEAD WO CONTRAST  Result Date: 12/13/2022 CLINICAL DATA:  Dizziness and left leg weakness EXAM: MRI HEAD WITHOUT CONTRAST MRA HEAD WITHOUT CONTRAST TECHNIQUE: Multiplanar, multi-echo pulse sequences of the brain and surrounding structures were acquired without intravenous contrast. Angiographic images of the Circle of Willis were acquired using MRA technique without intravenous contrast. COMPARISON:  No prior MRI head available, correlation is made with CT head 12/12/2022 FINDINGS: MRI HEAD FINDINGS Brain: Restricted diffusion with ADC correlate in the inferomedial left cerebellum (series 7, image 43 and series 5, images 53-55). No acute hemorrhage, mass, mass effect, or midline shift. No hemosiderin deposition to suggest remote hemorrhage. No hydrocephalus or extra-axial collection. Cerebral volume is within normal limits for age. Scattered T2 hyperintense signal in the periventricular white matter, likely the sequela of mild chronic small vessel ischemic disease. Vascular: Normal arterial flow voids. Skull and upper cervical spine: Normal marrow signal. Sinuses/Orbits: Mucosal thickening and mucous retention cyst in the left maxillary sinus. Mucosal thickening in the ethmoid air cells. No  acute finding in the orbits. Other: The mastoids are well aerated. MRA HEAD FINDINGS Anterior circulation: Both internal carotid arteries are patent to the termini, without significant stenosis. A1 segments patent. Normal anterior communicating artery. Anterior cerebral arteries are patent to their distal  aspects. No M1 stenosis or occlusion. Normal MCA bifurcations. Distal MCA branches perfused and symmetric. Posterior circulation: Vertebral arteries patent to the vertebrobasilar junction without stenosis. Posterior inferior cerebral arteries patent bilaterally. Basilar patent to its distal aspect. Superior cerebellar arteries patent bilaterally. Patent P1 segments. PCAs perfused to their distal aspects without stenosis. Early left PCA branching. The bilateral posterior communicating arteries are patent. Anatomic variants: None significant IMPRESSION: 1. Acute infarct in the inferomedial left cerebellum. 2. No intracranial large vessel occlusion or significant stenosis. These results will be called to the ordering clinician or representative by the Radiologist Assistant, and communication documented in the PACS or [employer]. Electronically Signed   By: Merilyn Baba M.D.   On: 12/13/2022 03:29   CT HEAD WO CONTRAST  Result Date: 12/12/2022 CLINICAL DATA:  Neuro deficit, acute, stroke suspected EXAM: CT HEAD WITHOUT CONTRAST TECHNIQUE: Contiguous axial images were obtained from the base of the skull through the vertex without intravenous contrast. RADIATION DOSE REDUCTION: This exam was performed according to the departmental dose-optimization program which includes automated exposure control, adjustment of the mA and/or kV according to patient size and/or use of iterative reconstruction technique. COMPARISON:  None Available. FINDINGS: Brain: No evidence of acute infarction, hemorrhage, hydrocephalus, extra-axial collection or mass lesion/mass effect. Vascular: No hyperdense vessel identified. Skull: No acute fracture. Sinuses/Orbits: Scattered paranasal sinus mucosal thickening. No acute orbital findings. Other: No mastoid effusions. IMPRESSION: No evidence of acute intracranial abnormality. Electronically Signed   By: Margaretha Sheffield M.D.   On: 12/12/2022 15:46    PHYSICAL EXAM General: Alert,  well-nourished, well-developed pleasant Caucasian elderly patient in no acute distress Respiratory: Regular, unlabored respirations on room air  NEURO:  Mental Status: AA&Ox3  Speech/Language: speech is without dysarthria or aphasia.    Cranial Nerves:  II: PERRL. Visual fields full. III, IV, VI: EOMI. Eyelids elevate symmetrically.  V: Sensation is intact to light touch and symmetrical to face.  VII: Smile is symmetrical.  VIII: hearing intact to voice. IX, X: Phonation is normal.  XII: tongue is midline without fasciculations. Motor: 5/5 strength to all muscle groups tested.  Tone: is normal and bulk is normal Sensation- Intact to light touch bilaterally.  Coordination: FTN with slight ataxia in left upper and lower extremity and mild intention tremor left greater than right Gait- deferred  NIHSS 2.  Premorbid modified Rankin score 1  ASSESSMENT/PLAN Ms. Tammy Erickson is a 77 y.o. female with history of anxiety, hyperlipidemia and GERD presenting with acute onset left leg incoordination, weakness and leaning to the left while ambulating.  She was found to have a small left cerebellar stroke on MRI.  Stroke:  left lacunar cerebellar stroke Etiology: Likely small vessel disease less likely  cardioembolism CT head No acute abnormality.  MRI acute infarct in left cerebellum MRA no LVO or hemodynamically significant stenosis Carotid Doppler bilateral carotids near normal with minimal thickening or plaque 2D Echo pending LDL 110 HgbA1c 5.2 VTE prophylaxis -Lovenox    Diet   Diet Heart Room service appropriate? Yes; Fluid consistency: Thin   No antithrombotic prior to admission, now on aspirin 81 mg daily and clopidogrel 75 mg daily for 3 weeks followed by aspirin alone Therapy recommendations: No OT follow-up, PT  pending Disposition: Pending  Hypertension Home meds: None Stable Permissive hypertension (OK if < 220/120) but gradually normalize in 5-7 days Long-term BP  goal normotensive  Hyperlipidemia Home meds: Atorvastatin 10 mg daily, increase to 40 mg LDL 110, goal < 70 Continue statin at discharge   Other Stroke Risk Factors Advanced Age >/= 33   Other Active Problems none  Hospital day # [place] , MSN, AGACNP-BC [facility] See Amion for schedule and pager information 12/13/2022 12:48 PM   STROKE MD NOTE :  I have personally obtained history,examined this patient, reviewed notes, independently viewed imaging studies, participated in medical decision making and plan of care.ROS completed by me personally and pertinent positives fully documented  I have made any additions or clarifications directly to the above note. Agree with note above.  She presented with sudden onset of left lower extremity incoordination and gait ataxia due to small left cerebellar infarct etiology likely small vessel disease.  Recommend dual antiplatelet therapy aspirin and Plavix for 3 weeks followed by aspirin alone.  Continue ongoing stroke workup.  Aggressive risk factor modification.  Mobilize out of bed.  Therapy consults.  Patient may also consider possible participation in the [facility]  stroke study and was given written information to review and decide.  Discussed with patient, sister, son and daughter at the bedside and answered questions.  It was made clear that study participation is voluntary and patient can withdraw from study at any time.  Patient will get the same excellent medical care whether she is part of the study or not.  Greater than 50% time during this 50-minute visit were spent in counseling and coordination of care about a cerebellar stroke and discussion about evaluation and treatment and prevention and answering questions.  Antony Contras, MD Medical Director [facility] Pager: [phone] 12/13/2022 1:07 PM   To contact Stroke Continuity provider, please refer to [email or web address] After hours, contact General  Neurology

## 2022-12-14 LAB — ECHOCARDIOGRAM COMPLETE BUBBLE STUDY
Area-P 1/2: 2.48 cm2
Calc EF: 61.8 %
S' Lateral: 2.5 cm
Single Plane A2C EF: 65.6 %
Single Plane A4C EF: 59.7 %

## 2022-12-20 ENCOUNTER — Encounter: Payer: Self-pay | Admitting: *Deleted

## 2022-12-20 NOTE — Progress Notes (Unsigned)
Patient enrolled for [employer] to ship a 30 day cardiac event monitor to her address on file.  Letter with instructions mailed to patient.  Dr. Stanford Breed to read.

## 2022-12-26 ENCOUNTER — Telehealth: Payer: Self-pay

## 2022-12-26 DIAGNOSIS — I77819 Aortic ectasia, unspecified site: Secondary | ICD-10-CM | POA: Diagnosis not present

## 2022-12-26 DIAGNOSIS — E7800 Pure hypercholesterolemia, unspecified: Secondary | ICD-10-CM | POA: Diagnosis not present

## 2022-12-26 DIAGNOSIS — Z8673 Personal history of transient ischemic attack (TIA), and cerebral infarction without residual deficits: Secondary | ICD-10-CM | POA: Diagnosis not present

## 2022-12-26 DIAGNOSIS — F419 Anxiety disorder, unspecified: Secondary | ICD-10-CM | POA: Diagnosis not present

## 2022-12-26 NOTE — Patient Outreach (Signed)
  [facility] Care Coordination Follow Up  12/26/2022 Name:  LAKLYN RAYNARD MRN:  [id number] DOB:  04/21/1946  Subjective: AALIYHA GUERRIERO is a 77 y.o. year old female who is a primary care patient of Redmon, [place], [place]   An Emmi alert was received indicating patient responded to questions: Sad, hopeless, anxious, or empty?.   I reached out by phone to follow up on the alert and spoke to Patient. Patient states she is doing okay except hs not been resting well at night. This was going on prior to stroke. He has PCP follow up appt today and will discuss with MD. Reviewed and addressed red alert. Patient reports "feeling a little more anxious" about having a stroke. Support given. Reviewed s/s of stroke. Patient states she had already planned to speak with MD regarding her anxious feelings and possible need for meds. Patient has stroke MD contact info and will call to arrange follow up appt. No issues with transportation. States she has supportive sister who is helping her out. She confirms she has all her meds and no issues or concerns regarding them. No further RN CM needs or concerns at this time.   Care Coordination Interventions:  Yes, provided   Follow up plan: Advised patient that they would continue to get automated EMMI-Stroke post discharge calls to assess how they are doing following recent hospitalization and will receive a call from a nurse if any of their responses were abnormal. Patient voiced understanding and was appreciative of f/u call.   Encounter Outcome:  Pt. Visit Completed    [facility] Care Management Care Management Community Coordinator Direct Phone: [phone] Toll Free: [phone] Fax: [phone]

## 2022-12-26 NOTE — Patient Outreach (Signed)
Received a red flag Emmi stroke notification for Tammy Erickson. I have assigned Enzo Montgomery, RN to call for follow up and determine if there are any Case Management needs.    [facility], [place], [place] Management [phone]

## 2022-12-27 ENCOUNTER — Ambulatory Visit: Payer: Medicare Other | Attending: Cardiology

## 2022-12-27 DIAGNOSIS — I491 Atrial premature depolarization: Secondary | ICD-10-CM | POA: Diagnosis not present

## 2022-12-27 DIAGNOSIS — I639 Cerebral infarction, unspecified: Secondary | ICD-10-CM

## 2023-01-16 DIAGNOSIS — Z780 Asymptomatic menopausal state: Secondary | ICD-10-CM | POA: Diagnosis not present

## 2023-01-16 DIAGNOSIS — Z1231 Encounter for screening mammogram for malignant neoplasm of breast: Secondary | ICD-10-CM | POA: Diagnosis not present

## 2023-01-16 NOTE — Progress Notes (Deleted)
Referring-Tammy Redmon, PA Reason for referral-CVA  HPI: 77 year old female for evaluation of CVA at request of Lennie Odor, [place].  Patient admitted with CVA February 2024.  Also with hypertensive urgency.  Echocardiogram February 2014 shows normal LV function, mild left atrial enlargement and negative saline microcavitation study.  Carotid Dopplers February 2024 near normal bilaterally.  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  Monitor was ordered February 2024 but not performed.  Current Outpatient Medications  Medication Sig Dispense Refill   Ascorbic Acid (VITAMIN C PO) Take 2 tablets by mouth daily. When able to remember     atorvastatin (LIPITOR) 40 MG tablet Take 1 tablet (40 mg total) by mouth daily at 8 pm. 30 tablet 0   Calcium Carbonate Antacid (TUMS PO) Take 2 tablets by mouth as needed (reflux).     Carboxymethylcellulose Sodium (EYE DROPS OP) Place 2 drops into both eyes daily.     citalopram (CELEXA) 10 MG tablet Take 10 mg by mouth daily.  0   Fexofenadine HCl (ALLEGRA ALLERGY PO) Take 1 tablet by mouth daily.     FLONASE ALLERGY RELIEF 50 MCG/ACT nasal spray Place 1 spray into both nostrils daily as needed for allergies or rhinitis.     omeprazole (PRILOSEC) 20 MG capsule Take 20 mg by mouth daily.     Study - OCEANIC-STROKE - asundexian 50 mg or placebo tablet (PI-Sethi) Take 1 tablet (50 mg total) by mouth daily. For Investigational Use Only. Take at the same time each day (preferably in the morning). Tablet should be swallowed with water. 98 tablet 0   VITAMIN D PO Take 1 capsule by mouth daily.     No current facility-administered medications for this visit.    Allergies  Allergen Reactions   Penicillins Anaphylaxis and Swelling   Codeine Other (See Comments)    "Couldn't lift her head". Weakness    Sulfa Antibiotics Swelling   Doxycycline Itching and Rash    Motrin [Ibuprofen] Itching and Rash    No past medical history on file.  Past Surgical History:  Procedure Laterality Date   BREAST SURGERY      Social History   Socioeconomic History   Marital status: Married    Spouse name: Not on file   Number of children: Not on file   Years of education: Not on file   Highest education level: Not on file  Occupational History   Not on file  Tobacco Use   Smoking status: Never   Smokeless tobacco: Never  Substance and Sexual Activity   Alcohol use: Yes   Drug use: Not on file   Sexual activity: Not on file  Other Topics Concern   Not on file  Social History Narrative   Not on file   Social Determinants of Health   Financial Resource Strain: Not on file  Food Insecurity: Not on file  Transportation Needs: Not on file  Physical Activity: Not on file  Stress: Not on file  Social Connections: Not on file  Intimate Partner Violence: Not on file    No family history on file.  ROS: no fevers or chills, productive cough, hemoptysis, dysphasia, odynophagia, melena, hematochezia, dysuria, hematuria, rash, seizure activity, orthopnea, PND, pedal edema, claudication. Remaining systems are negative.  Physical Exam:   There were no vitals taken for this visit.  General:  Well developed/well nourished in NAD Skin warm/dry Patient not depressed  No peripheral clubbing Back-normal HEENT-normal/normal eyelids Neck supple/normal carotid upstroke bilaterally; no bruits; no JVD; no thyromegaly chest - CTA/ normal expansion CV - RRR/normal S1 and S2; no murmurs, rubs or gallops;  PMI nondisplaced Abdomen -NT/ND, no HSM, no mass, + bowel sounds, no bruit 2+ femoral pulses, no bruits Ext-no edema, chords, 2+ DP Neuro-grossly nonfocal  ECG - personally reviewed  A/P  1 recent CVA-  2 hypertension-  3 hyperlipidemia-  Kirk Ruths, MD

## 2023-01-17 ENCOUNTER — Telehealth: Payer: Self-pay | Admitting: Cardiology

## 2023-01-17 NOTE — Telephone Encounter (Signed)
Pt states she had a stroke 5 wks ago, Dr. Stanford Breed saw her in the hospital and a heart monitor was ordered for the pt. She states that she had trouble with it, it kept her awake at night and wasn't aware that she could put it on do not disturb, so she took it off. She states that family and friends are convincing her to do it again. Pt states she is sending in the old one, she would like to get a new one to redo. Pt wanted her appt with Dr. Stanford Breed on 3/22 moved to after the monitor study was done. In case another is ordered, moved the pt to 5/16.

## 2023-01-18 NOTE — Telephone Encounter (Signed)
Left message for patient to call, she will be billed for 2 monitors if she wants to wear it a second time. Want to make sure she is okay with that. We can also make her an appointment to have the monitor applied and answer any questions she has if needed.

## 2023-01-19 ENCOUNTER — Encounter: Payer: Self-pay | Admitting: *Deleted

## 2023-01-20 ENCOUNTER — Ambulatory Visit: Payer: Medicare Other | Admitting: Cardiology

## 2023-01-20 NOTE — Telephone Encounter (Signed)
Left message for pt to call.

## 2023-02-07 NOTE — Telephone Encounter (Signed)
Spoke with pt, discussed monitor and asked if patient wanted to move appointment up to a sooner time since her monitor was over. She would like to keep the appointment for may. Patient voiced understanding to call with any problems prior to follow up appointment.

## 2023-02-28 DIAGNOSIS — F419 Anxiety disorder, unspecified: Secondary | ICD-10-CM | POA: Diagnosis not present

## 2023-03-08 NOTE — Progress Notes (Signed)
Referring-Noelle Redmon, PA Reason for referral-CVA  HPI: 77 year old female for evaluation of CVA and hypertensive urgency at request of Milus Height, [place].  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  Patient admitted February 2024 with CVA.  Echocardiogram showed ejection fraction 60 to 65%, mild left atrial enlargement.  Saline microcavitation study was negative.  Carotid Dopplers showed near normal bilaterally.  Follow-up monitor March 2024 showed sinus rhythm with PACs.  Cardiology now asked to evaluate.  At time of event patient developed a sensation of leaning to the left.  She then developed left lower extremity weakness.  This is all resolved.  She denies dyspnea, chest pain or syncope.  She has had intermittent palpitations described as a flutter.  She has had this intermittently for years.  Current Outpatient Medications  Medication Sig Dispense Refill   ALPRAZolam (XANAX) 0.25 MG tablet Take 0.25 mg by mouth at bedtime as needed for anxiety.     Ascorbic Acid (VITAMIN C PO) Take 2 tablets by mouth daily. When able to remember     atorvastatin (LIPITOR) 40 MG tablet Take 1 tablet (40 mg total) by mouth daily at 8 pm. 30 tablet 0   Carboxymethylcellulose Sodium (EYE DROPS OP) Place 2 drops into both eyes daily.     citalopram (CELEXA) 10 MG tablet Take 10 mg by mouth daily.  0   Fexofenadine HCl (ALLEGRA ALLERGY PO) Take 1 tablet by mouth daily.     FLONASE ALLERGY RELIEF 50 MCG/ACT nasal spray Place 1 spray into both nostrils daily as needed for allergies or rhinitis.     omeprazole (PRILOSEC) 20 MG capsule Take 20 mg by mouth daily.     VITAMIN D PO Take 1 capsule by mouth daily.     No current facility-administered medications for this visit.    Allergies  Allergen Reactions   Penicillins Anaphylaxis and Swelling   Codeine Other (See Comments)    "Couldn't lift her head".  Weakness    Sulfa Antibiotics Swelling   Doxycycline Itching and Rash   Motrin [Ibuprofen] Itching and Rash     Past Medical History:  Diagnosis Date   Hyperlipidemia    Stroke [facility])     Past Surgical History:  Procedure Laterality Date   BREAST SURGERY      Social History   Socioeconomic History   Marital status: Married    Spouse name: Not on file   Number of children: 2   Years of education: Not on file   Highest education level: Not on file  Occupational History   Not on file  Tobacco Use   Smoking status: Never   Smokeless tobacco: Never  Substance and Sexual Activity   Alcohol use: Yes    Comment: 2 glasses per week   Drug use: Not on file   Sexual activity: Not on file  Other Topics Concern   Not on file  Social History Narrative   Not on file   Social Determinants of Health   Financial Resource Strain: Not on file  Food Insecurity: Not on file  Transportation Needs: Not on file  Physical Activity: Not on file  Stress: Not on file  Social Connections: Not on file  Intimate Partner Violence: Not on file    Family History  Problem Relation Age of Onset   Cancer Father     ROS: no fevers or chills, productive cough, hemoptysis, dysphasia,  odynophagia, melena, hematochezia, dysuria, hematuria, rash, seizure activity, orthopnea, PND, pedal edema, claudication. Remaining systems are negative.  Physical Exam:   Blood pressure 118/80, pulse 70, height 5\' 2"  (1.575 m), weight 157 lb 3.2 oz (71.3 kg), SpO2 98 %.  General:  Well developed/well nourished in NAD Skin warm/dry Patient not depressed No peripheral clubbing Back-normal HEENT-normal/normal eyelids Neck supple/normal carotid upstroke bilaterally; no bruits; no JVD; no thyromegaly chest - CTA/ normal expansion CV - RRR/normal S1 and S2; no murmurs, rubs or gallops;  PMI nondisplaced Abdomen -NT/ND, no HSM, no mass, + bowel sounds, no bruit 2+ femoral pulses, no bruits Ext-no edema, chords,  2+ DP Neuro-grossly nonfocal  ECG December 12, 2022-normal sinus rhythm with PACs; personally reviewed.  A/P  1 recent CVA-echocardiogram showed preserved LV function/negative saline microcavitation study and follow-up monitor showed sinus rhythm with PACs.  However she has continued to have palpitations described as a flutter.  I am concerned about the potential for atrial fibrillation causing his CVA.  Will refer to electrophysiology for consideration of implantable loop monitor.  2 hyperlipidemia-continue statin.  3 palpitations-nonconcerning possibility of atrial fibrillation.  We will refer for implantable loop monitor.  Olga Millers, MD

## 2023-03-16 ENCOUNTER — Encounter: Payer: Self-pay | Admitting: Cardiology

## 2023-03-16 ENCOUNTER — Ambulatory Visit: Payer: Medicare Other | Attending: Cardiology | Admitting: Cardiology

## 2023-03-16 VITALS — BP 118/80 | HR 70 | Ht 62.0 in | Wt 157.2 lb

## 2023-03-16 DIAGNOSIS — R002 Palpitations: Secondary | ICD-10-CM | POA: Diagnosis not present

## 2023-03-16 DIAGNOSIS — E7800 Pure hypercholesterolemia, unspecified: Secondary | ICD-10-CM | POA: Diagnosis not present

## 2023-03-16 DIAGNOSIS — I639 Cerebral infarction, unspecified: Secondary | ICD-10-CM | POA: Diagnosis not present

## 2023-03-16 NOTE — Patient Instructions (Signed)
    Follow-Up: At [facility], you and your health needs are our priority.  As part of our continuing mission to provide you with exceptional heart care, we have created designated Provider Care Teams.  These Care Teams include your primary Cardiologist (physician) and Advanced Practice Providers (APPs -  Physician Assistants and Nurse Practitioners) who all work together to provide you with the care you need, when you need it.  We recommend signing up for the patient portal called "MyChart".  Sign up information is provided on this After Visit Summary.  MyChart is used to connect with patients for Virtual Visits (Telemedicine).  Patients are able to view lab/test results, encounter notes, upcoming appointments, etc.  Non-urgent messages can be sent to your provider as well.   To learn more about what you can do with MyChart, go to [email or web address].    Your next appointment:   6 month(s)  Provider:   Brian Crenshaw MD    

## 2023-03-22 DIAGNOSIS — R0683 Snoring: Secondary | ICD-10-CM | POA: Diagnosis not present

## 2023-03-22 DIAGNOSIS — G479 Sleep disorder, unspecified: Secondary | ICD-10-CM | POA: Diagnosis not present

## 2023-04-06 DIAGNOSIS — G4733 Obstructive sleep apnea (adult) (pediatric): Secondary | ICD-10-CM | POA: Diagnosis not present

## 2023-04-18 DIAGNOSIS — G4733 Obstructive sleep apnea (adult) (pediatric): Secondary | ICD-10-CM | POA: Diagnosis not present

## 2023-05-10 ENCOUNTER — Ambulatory Visit: Payer: Medicare Other | Admitting: Cardiovascular Disease

## 2023-05-18 DIAGNOSIS — G4733 Obstructive sleep apnea (adult) (pediatric): Secondary | ICD-10-CM | POA: Diagnosis not present

## 2023-06-18 DIAGNOSIS — G4733 Obstructive sleep apnea (adult) (pediatric): Secondary | ICD-10-CM | POA: Diagnosis not present

## 2023-07-10 DIAGNOSIS — G4733 Obstructive sleep apnea (adult) (pediatric): Secondary | ICD-10-CM | POA: Diagnosis not present

## 2023-07-14 ENCOUNTER — Encounter: Payer: Self-pay | Admitting: Cardiology

## 2023-07-19 DIAGNOSIS — G4733 Obstructive sleep apnea (adult) (pediatric): Secondary | ICD-10-CM | POA: Diagnosis not present

## 2023-08-18 DIAGNOSIS — G4733 Obstructive sleep apnea (adult) (pediatric): Secondary | ICD-10-CM | POA: Diagnosis not present

## 2024-02-27 DIAGNOSIS — I77819 Aortic ectasia, unspecified site: Secondary | ICD-10-CM | POA: Diagnosis not present

## 2024-02-27 DIAGNOSIS — K219 Gastro-esophageal reflux disease without esophagitis: Secondary | ICD-10-CM | POA: Diagnosis not present

## 2024-02-27 DIAGNOSIS — E7800 Pure hypercholesterolemia, unspecified: Secondary | ICD-10-CM | POA: Diagnosis not present

## 2024-02-27 DIAGNOSIS — F419 Anxiety disorder, unspecified: Secondary | ICD-10-CM | POA: Diagnosis not present

## 2024-02-27 DIAGNOSIS — Z0000 Encounter for general adult medical examination without abnormal findings: Secondary | ICD-10-CM | POA: Diagnosis not present

## 2024-03-03 NOTE — Progress Notes (Unsigned)
 Cardiology Office Note:    Date:  03/04/2024  ID:  ESA CADD, DOB Jan 04, 1946, MRN [id number] PCP: Diamond Formica, PA  [facility] Providers Cardiologist:  Alexandria Angel, MD       Patient Profile:      S/p CVA in 12/2022 Carotid US  12/13/2022: No significant ICA stenosis bilaterally TTE 12/23/2022: EF 60-65, no RWMA, normal RVSF, normal PASP, mild LAE, trivial MR, AV sclerosis, ascending aorta 36 mm, bubble study negative Monitor 12/2022: NSR with PACs   Hyperlipidemia        Discussed the use of AI scribe software for clinical note transcription with the patient, who gave verbal consent to proceed.  History of Present Illness Tammy Erickson is a 78 y.o. female who returns for follow-up of cerebrovascular disease.  She was admitted in February 2024 with an acute stroke.  Carotid Dopplers demonstrated no significant ICA stenosis bilaterally, echocardiogram demonstrated normal LV function and negative bubble study, event monitor demonstrated no atrial fibrillation.  She saw Dr. Audery Blazing for follow-up in May 2024.  He referred her to EP for consideration of ILR.  The patient had to cancel the appointment secondary to being out of town.  She is here alone.  She experiences irregular heartbeats, described as feeling 'really irregular' and sometimes 'beating different.' These episodes occur intermittently, lasting for a couple of hours, and can keep her awake at night. They are sometimes accompanied by chest tightness. She attributes these episodes to caffeine, chocolate, wine, and fatigue. The frequency of these episodes has increased to about once a week. There is a family history of irregular heartbeats, with her father and sister also experiencing similar symptoms. She describes episodes she refers to as 'panic attacks,' where her heart rate can reach 148 beats per minute, though these are infrequent and have not occurred recently.  She has not had chest discomfort, heaviness, or  pressure during exertion. No shortness of breath or swelling in her legs. She walks her dog daily and does not smoke.   ROS-See HPI    Studies Reviewed:   EKG Interpretation Date/Time:  Monday Mar 04 2024 10:43:02 EDT Ventricular Rate:  53 PR Interval:  164 QRS Duration:  82 QT Interval:  458 QTC Calculation: 429 R Axis:   57  Text Interpretation: Sinus bradycardia with Premature supraventricular complexes No significant change since last tracing Confirmed by Marlyse Single [phone]) on 03/04/2024 11:09:11 AM    Results    Risk Assessment/Calculations:     HYPERTENSION CONTROL Vitals:   03/04/24 1036 03/04/24 1129  BP: (!) 142/96 (!) 170/96    The patient's blood pressure is elevated above target today.  In order to address the patient's elevated BP: Blood pressure will be monitored at home to determine if medication changes need to be made.          Physical Exam:   VS:  BP (!) 170/96   Pulse (!) 53   Ht 5\' 2"  (1.575 m)   Wt 165 lb (74.8 kg)   SpO2 95%   BMI 30.18 kg/m    Wt Readings from Last 3 Encounters:  03/04/24 165 lb (74.8 kg)  03/16/23 157 lb 3.2 oz (71.3 kg)  12/12/22 160 lb (72.6 kg)    Constitutional:      Appearance: Healthy appearance. Not in distress.  Neck:     Vascular: JVD normal.  Pulmonary:     Breath sounds: Normal breath sounds. No wheezing. No rales.  Cardiovascular:  Normal rate. Regular rhythm.     Murmurs: There is no murmur.  Edema:    Peripheral edema absent.  Abdominal:     Palpations: Abdomen is soft.        Assessment and Plan:   Assessment & Plan Palpitations She notes intermittent palpitations with episodes of rapid heart rate up to 148 bpm. EKG today shows normal sinus rhythm with PACs. Symptoms have been lifelong but may be increasing in frequency. Differential includes atrial fibrillation, which is a concern given family history and her previous stroke.  She had previously seen Dr. Audery Blazing who recommended referral to  EP for ILR.  This was never done.  Since it has been a year since her previous monitor, I think is reasonable to repeat this first.  If the monitor is inconclusive, I would recommend referral to EP for consideration of ILR. - Order Zio patch monitor to evaluate for arrhythmias, especially atrial fibrillation - If monitor does not demonstrate any significant arrhythmias, refer to EP for ILR. Elevated blood pressure reading Blood pressure is elevated today at 170/96.  She typically has optimal blood pressures at home.  Question if she has whitecoat hypertension.  Goal blood pressure is less than 130/80, ideally 120/80 or less. If blood pressure remains elevated, antihypertensive medication may be necessary. - Check blood pressure daily for two weeks and send readings via MyChart - Consider antihypertensive medication if blood pressure remains elevated OSA (obstructive sleep apnea) She has stopped wearing her mask due to intolerance.  We discussed the importance of managing sleep apnea as this can contribute to hypertension and atrial fibrillation risk.  She plans to resume.         Dispo:  Return in about 6 months (around 09/04/2024) for Routine Follow Up w/ Dr. Audery Blazing.  Signed, Marlyse Single, PA-C

## 2024-03-04 ENCOUNTER — Encounter: Payer: Self-pay | Admitting: Physician Assistant

## 2024-03-04 ENCOUNTER — Ambulatory Visit: Attending: Physician Assistant | Admitting: Physician Assistant

## 2024-03-04 VITALS — BP 170/96 | HR 53 | Ht 62.0 in | Wt 165.0 lb

## 2024-03-04 DIAGNOSIS — I639 Cerebral infarction, unspecified: Secondary | ICD-10-CM

## 2024-03-04 DIAGNOSIS — R030 Elevated blood-pressure reading, without diagnosis of hypertension: Secondary | ICD-10-CM

## 2024-03-04 DIAGNOSIS — G4733 Obstructive sleep apnea (adult) (pediatric): Secondary | ICD-10-CM

## 2024-03-04 DIAGNOSIS — R002 Palpitations: Secondary | ICD-10-CM | POA: Diagnosis not present

## 2024-03-04 DIAGNOSIS — Z8673 Personal history of transient ischemic attack (TIA), and cerebral infarction without residual deficits: Secondary | ICD-10-CM | POA: Diagnosis not present

## 2024-03-04 NOTE — Patient Instructions (Signed)
 Medication Instructions:  Your physician recommends that you continue on your current medications as directed. Please refer to the Current Medication list given to you today.  *If you need a refill on your cardiac medications before your next appointment, please call your pharmacy*  Lab Work: None ordered  If you have labs (blood work) drawn today and your tests are completely normal, you will receive your results only by: MyChart Message (if you have MyChart) OR A paper copy in the mail If you have any lab test that is abnormal or we need to change your treatment, we will call you to review the results.  Testing/Procedures: ZIO XT- Long Term Monitor Instructions   Your physician has requested you wear your ZIO patch monitor_______days.   This is a single patch monitor.  [employer] supplies one patch monitor per enrollment.  Additional stickers are not available.   Please do not apply patch if you will be having a Nuclear Stress Test, Echocardiogram, Cardiac CT, MRI, or Chest Xray during the time frame you would be wearing the monitor. The patch cannot be worn during these tests.  You cannot remove and re-apply the ZIO XT patch monitor.   Your ZIO patch monitor will be sent USPS Priority mail from [facility] directly to your home address. The monitor may also be mailed to a PO BOX if home delivery is not available.   It may take 3-5 days to receive your monitor after you have been enrolled.   Once you have received you monitor, please review enclosed instructions.  Your monitor has already been registered assigning a specific monitor serial # to you.   Applying the monitor   Shave hair from upper left chest.   Hold abrader disc by orange tab.  Rub abrader in 40 strokes over left upper chest as indicated in your monitor instructions.   Clean area with 4 enclosed alcohol pads .  Use all pads to assure are is cleaned thoroughly.  Let dry.   Apply patch as indicated in monitor  instructions.  Patch will be place under collarbone on left side of chest with arrow pointing upward.   Rub patch adhesive wings for 2 minutes.Remove white label marked "1".  Remove white label marked "2".  Rub patch adhesive wings for 2 additional minutes.   While looking in a mirror, press and release button in center of patch.  A small green light will flash 3-4 times .  This will be your only indicator the monitor has been turned on.     Do not shower for the first 24 hours.  You may shower after the first 24 hours.   Press button if you feel a symptom. You will hear a small click.  Record Date, Time and Symptom in the Patient Log Book.   When you are ready to remove patch, follow instructions on last 2 pages of Patient Log Book.  Stick patch monitor onto last page of Patient Log Book.   Place Patient Log Book in [place] box.  Use locking tab on box and tape box closed securely.  The [employer] and [employer] has [employer] on it.  Please place in mailbox as soon as possible.  Your physician should have your test results approximately 7 days after the monitor has been mailed back to [facility].   Call [facility] Customer Care at [phone] if you have questions regarding your ZIO XT patch monitor.  Call them immediately if you see an orange light blinking  on your monitor.   If your monitor falls off in less than 4 days contact our Monitor department at [phone].  If your monitor becomes loose or falls off after 4 days call [employer] at [phone] for suggestions on securing your monitor.    Follow-Up: At [facility], you and your health needs are our priority.  As part of our continuing mission to provide you with exceptional heart care, our providers are all part of one team.  This team includes your primary Cardiologist (physician) and Advanced Practice Providers or APPs (Physician Assistants and Nurse Practitioners) who all work together to provide you with the care  you need, when you need it.  Your next appointment:   6 month(s)  Provider:   Alexandria Angel, MD or Marlyse Single, PA-C          We recommend signing up for the patient portal called "MyChart".  Sign up information is provided on this After Visit Summary.  MyChart is used to connect with patients for Virtual Visits (Telemedicine).  Patients are able to view lab/test results, encounter notes, upcoming appointments, etc.  Non-urgent messages can be sent to your provider as well.   To learn more about what you can do with MyChart, go to [email or web address].   Other Instructions

## 2024-03-06 ENCOUNTER — Ambulatory Visit: Attending: Physician Assistant

## 2024-03-06 DIAGNOSIS — R002 Palpitations: Secondary | ICD-10-CM

## 2024-03-06 DIAGNOSIS — I639 Cerebral infarction, unspecified: Secondary | ICD-10-CM

## 2024-03-06 NOTE — Progress Notes (Unsigned)
 Enrolled patient for a 14 day Zio XT monitor to be mailed to patients home  [address]

## 2024-03-07 DIAGNOSIS — K080 Exfoliation of teeth due to systemic causes: Secondary | ICD-10-CM | POA: Diagnosis not present

## 2024-04-22 DIAGNOSIS — I639 Cerebral infarction, unspecified: Secondary | ICD-10-CM

## 2024-04-22 DIAGNOSIS — R002 Palpitations: Secondary | ICD-10-CM

## 2024-04-24 ENCOUNTER — Ambulatory Visit: Payer: Self-pay | Admitting: Physician Assistant

## 2024-04-24 DIAGNOSIS — I4729 Other ventricular tachycardia: Secondary | ICD-10-CM

## 2024-04-24 DIAGNOSIS — R002 Palpitations: Secondary | ICD-10-CM

## 2024-04-24 DIAGNOSIS — R030 Elevated blood-pressure reading, without diagnosis of hypertension: Secondary | ICD-10-CM

## 2024-04-24 DIAGNOSIS — Z8673 Personal history of transient ischemic attack (TIA), and cerebral infarction without residual deficits: Secondary | ICD-10-CM

## 2024-04-26 NOTE — Telephone Encounter (Signed)
 Pt returning call, requesting cb

## 2024-05-07 ENCOUNTER — Encounter (HOSPITAL_COMMUNITY): Payer: Self-pay | Admitting: Physician Assistant

## 2024-05-17 ENCOUNTER — Encounter: Payer: Self-pay | Admitting: Advanced Practice Midwife

## 2024-06-05 NOTE — Progress Notes (Unsigned)
 Electrophysiology Office Note:    Date:  06/06/2024   ID:  Tammy Erickson, DOB [birth year], MRN [id number]  [facility] Cardiologist:  Redell Shallow, MD  [facility] Electrophysiologist:  OLE ONEIDA HOLTS, MD   Referring MD: Lelon Glendia ONEIDA, PA-C   Chief Complaint: Cryptogenic stroke  History of Present Illness:    Tammy Erickson is a 78 year old woman who I am seeing today for an evaluation of cryptogenic stroke at the request of Glendia Lelon.  The patient saw [facility] Mar 04, 2024.  No history of atrial fibrillation.  She also has palpitations.  A ZIO monitor was ordered at the appointment with Scott.  This showed no evidence of atrial fibrillation.  There was occasional supraventricular ectopy.  She presents to discuss loop recorder monitoring.      Their past medical, social and family history was reviewed.   ROS:   Please see the history of present illness.    All other systems reviewed and are negative.  EKGs/Labs/Other Studies Reviewed:    The following studies were reviewed today:         Physical Exam:    VS:  BP (!) 144/94 (BP Location: Left Arm, Patient Position: Sitting, Cuff Size: Normal)   Pulse (!) 58   Ht 5' 2 (1.575 m)   Wt 163 lb (73.9 kg)   SpO2 98%   BMI 29.81 kg/m     Wt Readings from Last 3 Encounters:  06/06/24 163 lb (73.9 kg)  03/04/24 165 lb (74.8 kg)  03/16/23 157 lb 3.2 oz (71.3 kg)     GEN: no distress CARD: RRR, No MRG RESP: No IWOB. CTAB.        ASSESSMENT AND PLAN:    1. Palpitations   2. History of stroke     #Cryptogenic Stroke Pathophysiology of cryptogenic stroke was discussed in detail during today's clinic appointment. I discussed the role of loop recorder monitoring in patients who have suffered a CVA/TIA. There has been no evidence of AF thus far in the patient's evaluation. Loop recorder monitors were discussed in detail including the implant procedure and its risks. I discussed the monthly monitoring costs  associated with loop recorder monitoring. The patient would like to proceed with ILR implant.  #Palpitations Loop recorder as above      Signed, OLE T. HOLTS, MD, [facility], [facility] 06/06/2024 12:36 PM    Electrophysiology  [facility]  -------------------------  SURGEON:  OLE ONEIDA HOLTS, MD     PREPROCEDURE DIAGNOSIS: Cryptogenic stroke    POSTPROCEDURE DIAGNOSIS: Cryptogenic stroke     PROCEDURES:   1. Implantable loop recorder implantation    INTRODUCTION:  Tammy Erickson presents with a history of cryptogenic stroke The costs of loop recorder monitoring have been discussed with the patient.    DESCRIPTION OF PROCEDURE:  Informed written consent was obtained.  A preprocedural timeout was performed with the RN Huntley). The patient required no sedation for the procedure today.  Mapping over the patient's chest was performed to identify the area where electrograms were most prominent for ILR recording.  This area was found to be the left parasternal region over the 4th intercostal space. The patients left chest was therefore prepped and draped in the usual sterile fashion. The skin overlying the left parasternal region was infiltrated with lidocaine for local analgesia.  A 0.5-cm incision was made over the left parasternal region over the 3rd intercostal space.  A subcutaneous ILR pocket was fashioned using a combination  of sharp and blunt dissection.  A Medtronic Reveal LINQ 2 implantable loop recorder was then placed into the pocket  R waves were very prominent and measured >0.74mV.  Steri- Strips and a sterile dressing were then applied.  There were no early apparent complications.     CONCLUSIONS:   1. Successful implantation of a implantable loop recorder for Cryptogenic stroke  2. No early apparent complications.   Ole T. Cindie, MD, [facility], [facility] Cardiac Electrophysiology

## 2024-06-06 ENCOUNTER — Encounter: Payer: Self-pay | Admitting: Cardiology

## 2024-06-06 ENCOUNTER — Ambulatory Visit: Attending: Cardiology | Admitting: Cardiology

## 2024-06-06 VITALS — BP 144/94 | HR 58 | Ht 62.0 in | Wt 163.0 lb

## 2024-06-06 DIAGNOSIS — R002 Palpitations: Secondary | ICD-10-CM | POA: Diagnosis not present

## 2024-06-06 DIAGNOSIS — Z8673 Personal history of transient ischemic attack (TIA), and cerebral infarction without residual deficits: Secondary | ICD-10-CM | POA: Diagnosis not present

## 2024-06-06 NOTE — Patient Instructions (Addendum)
Medication Instructions:  Your physician recommends that you continue on your current medications as directed. Please refer to the Current Medication list given to you today.  Labwork: None ordered.  Testing/Procedures: None ordered.  Follow-Up:  As needed with Dr. Lalla Brothers  Implantable Loop Recorder Placement, Care After This sheet gives you information about how to care for yourself after your procedure. Your health care provider may also give you more specific instructions. If you have problems or questions, contact your health care provider. What can I expect after the procedure? After the procedure, it is common to have: Soreness or discomfort near the incision. Some swelling or bruising near the incision.  Follow these instructions at home: Incision care  Monitor your cardiac device site for redness, swelling, and drainage. Call the device clinic at [phone] if you experience these symptoms or fever/chills.  Keep the large square bandage on your site for 24 hours and then you may remove it yourself. Keep the steri-strips underneath in place.   You may shower after 72 hours / 3 days from your procedure with the steri-strips in place. They will usually fall off on their own, or may be removed after 10 days. Pat dry.   Avoid lotions, ointments, or perfumes over your incision until it is well-healed.  Please do not submerge in water until your site is completely healed.   Your device is MRI compatible.   Remote monitoring is used to monitor your cardiac device from home. This monitoring is scheduled every month by our office. It allows [place] to keep an eye on the function of your device to ensure it is working properly.  If your wound site starts to bleed apply pressure.    For help with the monitor please call Medtronic Monitor Support Specialist directly at [phone].    If you have any questions/concerns please call the device clinic at  [phone].  Activity  Return to your normal activities.  General instructions Follow instructions from your health care provider about how to manage your implantable loop recorder and transmit the information. Learn how to activate a recording if this is necessary for your type of device. You may go through a metal detection gate, and you may let someone hold a metal detector over your chest. Show your ID card if needed. Do not have an MRI unless you check with your health care provider first. Take over-the-counter and prescription medicines only as told by your health care provider. Keep all follow-up visits as told by your health care provider. This is important. Contact a health care provider if: You have redness, swelling, or pain around your incision. You have a fever. You have pain that is not relieved by your pain medicine. You have triggered your device because of fainting (syncope) or because of a heartbeat that feels like it is racing, slow, fluttering, or skipping (palpitations). Get help right away if you have: Chest pain. Difficulty breathing. Summary After the procedure, it is common to have soreness or discomfort near the incision. Change your dressing as told by your health care provider. Follow instructions from your health care provider about how to manage your implantable loop recorder and transmit the information. Keep all follow-up visits as told by your health care provider. This is important. This information is not intended to replace advice given to you by your health care provider. Make sure you discuss any questions you have with your health care provider. Document Released: 09/28/2015 Document Revised: 12/02/2017 Document Reviewed: 12/02/2017 Elsevier Patient  Education  2020 Elsevier Inc.  

## 2024-06-18 ENCOUNTER — Telehealth: Payer: Self-pay

## 2024-06-18 NOTE — Telephone Encounter (Signed)
  Loop Recorder Follow up   Is patient connected to [facility]link/[facility]? Yes   Have steri-strips fallen off or been removed? No   Does the patient need in office follow up? No   Please continue to monitor your cardiac device site for redness, swelling, and drainage. Call the device clinic at [phone] if you experience these symptoms, fever/chills, or have questions about your device.   Remote monitoring is used to monitor your cardiac device from home. This monitoring is scheduled every month by our office. It allows [place] to keep an eye on the functioning of your device to ensure it is working properly.

## 2024-07-03 ENCOUNTER — Ambulatory Visit (HOSPITAL_COMMUNITY)

## 2024-07-08 ENCOUNTER — Telehealth: Payer: Self-pay | Admitting: Cardiology

## 2024-07-08 NOTE — Telephone Encounter (Signed)
 Alert remote transmission: AF/tachy 12 logged AF events, and 17 tachy, presenting likely AF with RVR, no OAC per EPIC - route to triage high alert per protocol  ___________________________________________________________________________  Transmission reviewed. Does appear that the patient was having AF episodes that occurred on 07/06/24.  Presenting rhythm as of 07/07/24 shows NSR.   Reviewed tracings with Jodie Passey, PA who confirms that the patient is having new onset AF.   Attempted to contact the patient to follow up and set up an appointment with the AF Clinic. No answer- I left a message to please call back to the Device Clinic.

## 2024-07-09 NOTE — Telephone Encounter (Signed)
Attempted to contact patient. No answer, left message to call back

## 2024-07-10 NOTE — Telephone Encounter (Signed)
Attempted to contact patient. No answer, left message to call back

## 2024-07-11 NOTE — Telephone Encounter (Signed)
 Call back received from Pt.  Per Pt she took Nyquil the night before the AF episodes noted.  She remembers waking up to her heart racing.  She believes the Nyquil caused the episode.  Per review of Carelink, she has not had any further episodes since 07/06/2024.  Will forward to physician for further recommendation:  watchful waiting vs referral to AF clinic.

## 2024-07-11 NOTE — Telephone Encounter (Signed)
 Attempted to contact Pt.  No answer.  Have left 2 VM's previously.  Attempted to contact sister per DPR.  Call went to VM.  Left message requesting call back.  Will send certified letter after 3 attempted contacts with no response.

## 2024-07-12 ENCOUNTER — Ambulatory Visit (INDEPENDENT_AMBULATORY_CARE_PROVIDER_SITE_OTHER)

## 2024-07-12 DIAGNOSIS — I4729 Other ventricular tachycardia: Secondary | ICD-10-CM

## 2024-07-12 NOTE — Telephone Encounter (Signed)
 Patient made aware and is in agreement to see the AF clinic.  Forwarding referral request to AF clinic to follow up next week with patient for an appointment.  Patient knows to be waiting for their call.

## 2024-07-15 LAB — CUP PACEART REMOTE DEVICE CHECK
Date Time Interrogation Session: 20250908110942
Implantable Pulse Generator Implant Date: 20250807

## 2024-07-18 ENCOUNTER — Ambulatory Visit: Payer: Self-pay | Admitting: Cardiology

## 2024-07-19 NOTE — Progress Notes (Signed)
 Remote Loop Recorder Transmission

## 2024-07-19 NOTE — Telephone Encounter (Signed)
 Tammy Erickson DEL to Gershon Alan BROCKS, RN  MS    07/15/24 10:10 AM Patient stated she wanted to do some checking with her doctor before making this appointment.

## 2024-08-02 ENCOUNTER — Telehealth: Payer: Self-pay

## 2024-08-02 NOTE — Telephone Encounter (Signed)
 Alert received:  Alert remote transmission:  AF, tachy AF/AFL in progress from 10/3 @ 01:44  Outreach made to Pt.  Left detailed message requesting call back.  Additional episodes of AFib.  Pt previously advised to follow up with Afib clinic but declined.

## 2024-08-05 NOTE — Telephone Encounter (Signed)
 Patient called back to clinic about Afib  This RN briefly explained what Afib was and the risks associated with it to the patient  Stressed importance of being seen in Afib clinic for AF management   Patient agreeable to this   Spoke with AF clinic scheduling team and got patient appointment for 08/06/24 at 9am  New address and location of AF clinic provided to patient  Patient verbalized correct date, time and location of her appointment tomorrow to this RN  All questions and concerns addressed at this time  Patient very appreciative for assistance getting appt, and also having her questions addressed

## 2024-08-06 ENCOUNTER — Encounter (HOSPITAL_COMMUNITY): Payer: Self-pay | Admitting: Internal Medicine

## 2024-08-06 ENCOUNTER — Other Ambulatory Visit (HOSPITAL_COMMUNITY): Payer: Self-pay

## 2024-08-06 ENCOUNTER — Ambulatory Visit (HOSPITAL_COMMUNITY)
Admission: RE | Admit: 2024-08-06 | Discharge: 2024-08-06 | Disposition: A | Discharge status: Home / Self Care | Source: Ambulatory Visit | Attending: Internal Medicine | Admitting: Internal Medicine

## 2024-08-06 VITALS — BP 156/100 | HR 54 | Ht 62.0 in | Wt 164.6 lb

## 2024-08-06 DIAGNOSIS — I4891 Unspecified atrial fibrillation: Secondary | ICD-10-CM | POA: Diagnosis not present

## 2024-08-06 DIAGNOSIS — D6869 Other thrombophilia: Secondary | ICD-10-CM | POA: Diagnosis not present

## 2024-08-06 DIAGNOSIS — I480 Paroxysmal atrial fibrillation: Secondary | ICD-10-CM

## 2024-08-06 MED ORDER — APIXABAN 5 MG PO TABS: 5.0000 mg | ORAL_TABLET | Freq: Two times a day (BID) | ORAL | 3 refills | Status: AC

## 2024-08-06 NOTE — Progress Notes (Signed)
 Primary Care Physician: Alvera Reagin, PA Primary Cardiologist: Redell Shallow, MD Electrophysiologist: OLE ONEIDA HOLTS, MD     Referring Physician: Device clinic     Tammy Erickson is a 78 y.o. female with a history of CVA, HTN, HLD, and atrial fibrillation who presents for consultation in the [facility]. ILR review on 9/18 showed 12 AF episodes with longest duration 5 hr 24 min; last episode on 10/3. Patient has a CHADS2VASC score of 6.  On evaluation today, patient is currently in NSR. Patient notes to drink coffee daily and can have 1-2 glasses of wine several times per week. Patient notes history of OSA but not compliant with CPAP. She wears an older Apple watch but does not check regularly.   Today, she denies symptoms of chest pain, shortness of breath, orthopnea, PND, lower extremity edema, dizziness, presyncope, syncope, bleeding, or neurologic sequela. The patient is tolerating medications without difficulties and is otherwise without complaint today.    Atrial Fibrillation Risk Factors:  she does have symptoms or diagnosis of sleep apnea. she is not compliant with CPAP therapy.   she has a BMI of Body mass index is 30.11 kg/m.SABRA Filed Weights   08/06/24 0908  Weight: 74.7 kg    Current Outpatient Medications  Medication Sig Dispense Refill   ALPRAZolam (XANAX) 0.25 MG tablet Take 0.25 mg by mouth at bedtime as needed for anxiety.     apixaban (ELIQUIS) 5 MG TABS tablet Take 1 tablet (5 mg total) by mouth 2 (two) times daily. 60 tablet 3   Ascorbic Acid  (VITAMIN C  PO) Take 2 tablets by mouth daily. When able to remember     atorvastatin  (LIPITOR) 40 MG tablet Take 1 tablet (40 mg total) by mouth daily at 8 pm. 30 tablet 0   Carboxymethylcellulose Sodium (EYE DROPS OP) Place 2 drops into both eyes daily.     citalopram  (CELEXA ) 10 MG tablet Take 10 mg by mouth daily.  0   Fexofenadine HCl (ALLEGRA ALLERGY PO) Take 1 tablet by mouth  daily.     FLONASE  ALLERGY RELIEF 50 MCG/ACT nasal spray Place 1 spray into both nostrils daily as needed for allergies or rhinitis.     omeprazole (PRILOSEC) 20 MG capsule Take 20 mg by mouth daily.     VITAMIN D  PO Take 1 capsule by mouth daily.     No current facility-administered medications for this encounter.    Atrial Fibrillation Management history:  Previous antiarrhythmic drugs: none Previous cardioversions: none Previous ablations: none Anticoagulation history: none   ROS- All systems are reviewed and negative except as per the HPI above.  Physical Exam: BP (!) 156/100   Pulse (!) 54   Ht 5' 2 (1.575 m)   Wt 74.7 kg   BMI 30.11 kg/m   GEN: Well nourished, well developed in no acute distress NECK: No JVD; No carotid bruits CARDIAC: Regular rate and rhythm, no murmurs, rubs, gallops RESPIRATORY:  Clear to auscultation without rales, wheezing or rhonchi  ABDOMEN: Soft, non-tender, non-distended EXTREMITIES:  No edema; No deformity   EKG today demonstrates  Vent. rate 54 BPM PR interval 158 ms QRS duration 76 ms QT/QTcB 452/428 ms P-R-T axes 68 22 28 Sinus bradycardia with Premature supraventricular complexes Cannot rule out Anterior infarct , age undetermined Abnormal ECG When compared with ECG of 04-Mar-2024 10:43, No significant change was found  Echo 12/13/22 demonstrated   1. Left ventricular ejection fraction, by estimation, is 60 to  65%. The  left ventricle has normal function. The left ventricle has no regional  wall motion abnormalities. Left ventricular diastolic parameters were  normal.   2. Right ventricular systolic function is normal. The right ventricular  size is normal. There is normal pulmonary artery systolic pressure.   3. Left atrial size was mildly dilated.   4. The mitral valve is normal in structure. Trivial mitral valve  regurgitation.   5. The aortic valve is tricuspid. There is mild thickening of the aortic  valve. Aortic valve  regurgitation is not visualized. Aortic valve  sclerosis is present, with no evidence of aortic valve stenosis.   6. Aortic dilatation noted. There is borderline dilatation of the  ascending aorta, measuring 36 mm.   7. The inferior vena cava is normal in size with <50% respiratory  variability, suggesting right atrial pressure of 8 mmHg.   8. Agitated saline contrast bubble study was negative, with no evidence  of any interatrial shunt.   ASSESSMENT & PLAN CHA2DS2-VASc Score = 6  The patient's score is based upon: CHF History: 0 HTN History: 1 Diabetes History: 0 Stroke History: 2 Vascular Disease History: 0 Age Score: 2 Gender Score: 1       ASSESSMENT AND PLAN: Paroxysmal Atrial Fibrillation (ICD10:  I48.0) The patient's CHA2DS2-VASc score is 6, indicating a 9.7% annual risk of stroke.    Patient is currently in NSR. Education provided about Afib. Discussion about triggers for Afib and medication treatments and ablation going forward if indicated. After discussion, we will proceed with conservative observation at this time via subsequent ILR checks. We discussed reduction in caffeine intake and alcohol intake. We discussed compliance with CPAP.   Secondary Hypercoagulable State (ICD10:  D68.69) The patient is at significant risk for stroke/thromboembolism based upon her CHA2DS2-VASc Score of 6.  Start Apixaban (Eliquis).  We discussed the reasoning behind anticoagulation in the setting of stroke prevention related to Afib. We discussed the benefits vs risks of anticoagulation. After discussion, patient would like to begin anticoagulation and understands the potential risks. Will begin Eliquis 5 mg BID and print order for patient to have CBC in 1 month.  Elevated blood pressure reading Recommended to monitor BP at home and keep a BP diary     Follow up 6 months Afib clinic.   Terra Pac, [facility]  Afib Clinic [address] [place], [place] [address] [phone]

## 2024-08-06 NOTE — Patient Instructions (Signed)
 Start Eliquis 5 mg twice a day   Get lab work in one month 09/06/24 at any [facility] You may go to any [facility] Location for your lab work:   [facility] - [address] ([facility]) - [address] [phone] N. [address] If you have any lab test that is abnormal or we need to change your treatment, we will call you

## 2024-08-12 ENCOUNTER — Encounter

## 2024-08-12 ENCOUNTER — Ambulatory Visit: Payer: Self-pay | Admitting: Cardiology

## 2024-08-12 ENCOUNTER — Ambulatory Visit (INDEPENDENT_AMBULATORY_CARE_PROVIDER_SITE_OTHER)

## 2024-08-12 DIAGNOSIS — R002 Palpitations: Secondary | ICD-10-CM | POA: Diagnosis not present

## 2024-08-12 LAB — CUP PACEART REMOTE DEVICE CHECK
Date Time Interrogation Session: 20251012235146
Implantable Pulse Generator Implant Date: 20250807

## 2024-08-13 NOTE — Progress Notes (Signed)
 Remote Loop Recorder Transmission

## 2024-09-09 ENCOUNTER — Telehealth: Payer: Self-pay

## 2024-09-09 NOTE — Telephone Encounter (Signed)
 ILR implanted for CVA:   Alert remote transmission: 14 tachy events up to 50 min, V-rates up to 207 bpm.  NCT with irregular R-R.  21 AF events for 3% up to 8 hrs 40 min.  Presenting EGM with AF with faster rates.   Recent increase in AF with associated increase in V-rates with AF.   Patient is being followed by Fairy Heinrich, PA-C in AF clinic.  Forwarding to his team for follow up.

## 2024-09-12 ENCOUNTER — Ambulatory Visit

## 2024-09-12 ENCOUNTER — Encounter

## 2024-09-12 DIAGNOSIS — I4729 Other ventricular tachycardia: Secondary | ICD-10-CM

## 2024-09-12 LAB — CUP PACEART REMOTE DEVICE CHECK
Date Time Interrogation Session: 20251112234702
Implantable Pulse Generator Implant Date: 20250807

## 2024-09-17 ENCOUNTER — Ambulatory Visit: Payer: Self-pay | Admitting: Cardiology

## 2024-09-17 NOTE — Telephone Encounter (Signed)
 Patient called back stating she had a very busy weekend with her family at a Christmas show and was trying to keep up with all of them. She feels this is the culprit of her intermittent increased rates. She was also anxious during this timeframe. Pt was not symptomatic with the episodes.  Pt would prefer to continue monitoring burden at this time but if burden continues to increase she would be agreeable to discuss afib management.

## 2024-09-17 NOTE — Progress Notes (Signed)
 Remote Loop Recorder Transmission

## 2024-09-23 ENCOUNTER — Telehealth: Payer: Self-pay | Admitting: *Deleted

## 2024-09-23 MED ORDER — METOPROLOL SUCCINATE ER 25 MG PO TB24: 12.5000 mg | ORAL_TABLET | Freq: Every day | ORAL | 3 refills | Status: DC

## 2024-09-23 NOTE — Telephone Encounter (Signed)
 ILR implanted for CVA:  Alert remote transmission: AF with RVR Hx of AF, longest duration 8hrs , poor rate control, burden has increased 39.9%, Eliquis  per EPIC - route to triage AF alerts 11/21, 11/23, and 11/24 Follow up as scheduled. LA, CVRS ______________________________________________________________________  Patient is being followed by Fairy Heinrich, PA-C in AF clinic.  Forwarding to his team for follow up.

## 2024-09-23 NOTE — Telephone Encounter (Signed)
 Spoke with pt she was able to feel palpitations. Discussed with J.Suarez he wants pt to start Toprol  12.5 mg daily and monitor BP and HR. Appt made for next week when she returns from the beach.

## 2024-10-02 DIAGNOSIS — Z1231 Encounter for screening mammogram for malignant neoplasm of breast: Secondary | ICD-10-CM | POA: Diagnosis not present

## 2024-10-03 ENCOUNTER — Ambulatory Visit (HOSPITAL_COMMUNITY): Admitting: Internal Medicine

## 2024-10-07 ENCOUNTER — Other Ambulatory Visit (HOSPITAL_COMMUNITY): Payer: Self-pay

## 2024-10-07 ENCOUNTER — Encounter (HOSPITAL_COMMUNITY): Payer: Self-pay | Admitting: Internal Medicine

## 2024-10-07 ENCOUNTER — Ambulatory Visit (HOSPITAL_COMMUNITY): Admission: RE | Admit: 2024-10-07 | Discharge: 2024-10-07 | Attending: Internal Medicine | Admitting: Internal Medicine

## 2024-10-07 VITALS — BP 146/110 | HR 165 | Ht 62.0 in | Wt 168.6 lb

## 2024-10-07 DIAGNOSIS — D6869 Other thrombophilia: Secondary | ICD-10-CM | POA: Diagnosis not present

## 2024-10-07 DIAGNOSIS — I480 Paroxysmal atrial fibrillation: Secondary | ICD-10-CM

## 2024-10-07 MED ORDER — METOPROLOL SUCCINATE ER 25 MG PO TB24: 25.0000 mg | ORAL_TABLET | Freq: Every day | ORAL | Status: DC

## 2024-10-07 MED ORDER — FLECAINIDE ACETATE 100 MG PO TABS: 100.0000 mg | ORAL_TABLET | Freq: Two times a day (BID) | ORAL | 3 refills | Status: DC

## 2024-10-07 NOTE — Progress Notes (Addendum)
 Primary Care Physician: Alvera Reagin, PA Primary Cardiologist: Redell Shallow, MD Electrophysiologist: OLE ONEIDA HOLTS, MD     Referring Physician: Device clinic     Tammy Erickson is a 78 y.o. female with a history of CVA, HTN, HLD, and atrial fibrillation who presents for consultation in the [facility]ibr[facility]. ILR review on 9/18 showed 12 AF episodes with longest duration 5 hr 24 min; last episode on 10/3. Patient has a CHADS2VASC score of 6.  On follow-up 10/07/2024, patient is currently in A-fib with RVR.  Patient has had several device clinic alerts noting increased A-fib burden.  Most recent device clinic alert on 11/24 for 39.9% A-fib burden.  Patient is taking Toprol  12.5 mg daily but has not taken morning dose today.  No missed doses of Eliquis .  Today, she denies symptoms of orthopnea, PND, lower extremity edema, dizziness, presyncope, syncope, bleeding, or neurologic sequela. The patient is tolerating medications without difficulties and is otherwise without complaint today.    Atrial Fibrillation Risk Factors:  she does have symptoms or diagnosis of sleep apnea. she is not compliant with CPAP therapy.   she has a BMI of Body mass index is 30.84 kg/m.SABRA Filed Weights   10/07/24 0843  Weight: 76.5 kg    Current Outpatient Medications  Medication Sig Dispense Refill   ALPRAZolam (XANAX) 0.25 MG tablet Take 0.25 mg by mouth at bedtime as needed for anxiety.     apixaban  (ELIQUIS ) 5 MG TABS tablet Take 1 tablet (5 mg total) by mouth 2 (two) times daily. 60 tablet 3   Ascorbic Acid  (VITAMIN C  PO) Take 2 tablets by mouth daily. When able to remember     atorvastatin  (LIPITOR) 40 MG tablet Take 1 tablet (40 mg total) by mouth daily at 8 pm. 30 tablet 0   Carboxymethylcellulose Sodium (EYE DROPS OP) Place 2 drops into both eyes daily.     citalopram  (CELEXA ) 10 MG tablet Take 10 mg by mouth daily.  0   Fexofenadine HCl (ALLEGRA ALLERGY PO) Take 1  tablet by mouth daily.     flecainide  (TAMBOCOR ) 100 MG tablet Take 1 tablet (100 mg total) by mouth 2 (two) times daily. 60 tablet 3   FLONASE  ALLERGY RELIEF 50 MCG/ACT nasal spray Place 1 spray into both nostrils daily as needed for allergies or rhinitis.     omeprazole (PRILOSEC) 20 MG capsule Take 20 mg by mouth daily.     VITAMIN D  PO Take 1 capsule by mouth daily.     metoprolol  succinate (TOPROL  XL) 25 MG 24 hr tablet Take 1 tablet (25 mg total) by mouth daily.     No current facility-administered medications for this encounter.    Atrial Fibrillation Management history:  Previous antiarrhythmic drugs: none Previous cardioversions: none Previous ablations: none Anticoagulation history: Eliquis    ROS- All systems are reviewed and negative except as per the HPI above.  Physical Exam: BP (!) 146/110   Pulse (!) 165   Ht 5' 2 (1.575 m)   Wt 76.5 kg   BMI 30.84 kg/m   GEN- The patient is well appearing, alert and oriented x 3 today.   Neck - no JVD or carotid bruit noted Lungs- Clear to ausculation bilaterally, normal work of breathing Heart- Irregular tachycardic rate and rhythm, no murmurs, rubs or gallops, PMI not laterally displaced Extremities- no clubbing, cyanosis, or edema Skin - no rash or ecchymosis noted   EKG today demonstrates  EKG Interpretation Date/Time:  Monday October 07 2024 08:45:41 EST Ventricular Rate:  165 PR Interval:    QRS Duration:  66 QT Interval:  278 QTC Calculation: 460 R Axis:   35  Text Interpretation: Atrial fibrillation with rapid ventricular response Cannot rule out Anterior infarct (cited on or before 06-Aug-2024) Abnormal ECG When compared with ECG of 06-Aug-2024 09:10, Atrial fibrillation has replaced Sinus rhythm Confirmed by Terra Pac ([id number]) on 10/07/2024 9:39:17 AM    Echo 12/13/22 demonstrated   1. Left ventricular ejection fraction, by estimation, is 60 to 65%. The  left ventricle has normal function. The left  ventricle has no regional  wall motion abnormalities. Left ventricular diastolic parameters were  normal.   2. Right ventricular systolic function is normal. The right ventricular  size is normal. There is normal pulmonary artery systolic pressure.   3. Left atrial size was mildly dilated.   4. The mitral valve is normal in structure. Trivial mitral valve  regurgitation.   5. The aortic valve is tricuspid. There is mild thickening of the aortic  valve. Aortic valve regurgitation is not visualized. Aortic valve  sclerosis is present, with no evidence of aortic valve stenosis.   6. Aortic dilatation noted. There is borderline dilatation of the  ascending aorta, measuring 36 mm.   7. The inferior vena cava is normal in size with <50% respiratory  variability, suggesting right atrial pressure of 8 mmHg.   8. Agitated saline contrast bubble study was negative, with no evidence  of any interatrial shunt.   ASSESSMENT & PLAN CHA2DS2-VASc Score = 6  The patient's score is based upon: CHF History: 0 HTN History: 1 Diabetes History: 0 Stroke History: 2 Vascular Disease History: 0 Age Score: 2 Gender Score: 1       ASSESSMENT AND PLAN: Paroxysmal Atrial Fibrillation (ICD10:  I48.0) The patient's CHA2DS2-VASc score is 6, indicating a 9.7% annual risk of stroke.    Patient is currently in A-fib with RVR.  Due to patient's marked tachycardia, I went over ED precautions with patient.  She can feel palpitations and a little bit of chest discomfort, but declines ED visit today.  Patient understands that if her symptoms worsen, I recommend for patient to seek immediate emergent evaluation.  We discussed rhythm control options given her elevated A-fib burden noted on device alerts.  After discussion, patient will begin flecainide  100 mg twice daily and continue her metoprolol  dose.  We will increase her metoprolol  to 25 mg 1 whole tablet daily.  She is paroxysmal so will not pursue cardioversion.   Patient will follow-up for repeat EKG in 7 days.  I will then schedule patient for treadmill test.  We also discussed ablation as a procedure, its potential risks, and what to expect.  After discussion, we will refer patient to EP to discuss ablation per her request.  Will order updated echocardiogram.  Secondary Hypercoagulable State (ICD10:  D68.69) The patient is at significant risk for stroke/thromboembolism based upon her CHA2DS2-VASc Score of 6. Continue Apixaban  (Eliquis ).  Emphasis placed on compliance with anticoagulation.  Continue Eliquis  5 mg twice daily.     Follow up 7 to 10 days for repeat EKG.    Terra Pac, [facility]  Afib Clinic [address] [place], [place] [address] [phone]

## 2024-10-07 NOTE — Addendum Note (Signed)
 Encounter addended by: Terra Fairy PARAS, PA-C on: 10/07/2024 3:48 PM  Actions taken: Clinical Note Signed

## 2024-10-07 NOTE — Patient Instructions (Addendum)
 Increase metoprolol  to 25mg  once a day    Start Flecainide  100mg  twice a day

## 2024-10-13 ENCOUNTER — Ambulatory Visit

## 2024-10-13 DIAGNOSIS — I480 Paroxysmal atrial fibrillation: Secondary | ICD-10-CM

## 2024-10-14 ENCOUNTER — Ambulatory Visit (HOSPITAL_COMMUNITY): Admission: RE | Admit: 2024-10-14 | Discharge: 2024-10-14 | Attending: Internal Medicine | Admitting: Internal Medicine

## 2024-10-14 ENCOUNTER — Encounter

## 2024-10-14 DIAGNOSIS — I480 Paroxysmal atrial fibrillation: Secondary | ICD-10-CM

## 2024-10-14 DIAGNOSIS — Z5181 Encounter for therapeutic drug level monitoring: Secondary | ICD-10-CM

## 2024-10-14 DIAGNOSIS — D6869 Other thrombophilia: Secondary | ICD-10-CM

## 2024-10-14 MED ORDER — METOPROLOL SUCCINATE ER 25 MG PO TB24: 12.5000 mg | ORAL_TABLET | Freq: Every day | ORAL | 1 refills | Status: DC

## 2024-10-14 MED ORDER — FLECAINIDE ACETATE 50 MG PO TABS: 50.0000 mg | ORAL_TABLET | Freq: Two times a day (BID) | ORAL | 2 refills | Status: AC

## 2024-10-14 NOTE — Progress Notes (Signed)
 Patient began flecainide  100 mg twice daily on 12/8 due to A-fib with RVR.  She notes that from an A-fib standpoint she is better however she notes significant shortness of breath.  This symptom was noted previously but it is now worse since start of flecainide  and metoprolol .  ECG today shows  EKG Interpretation Date/Time:  Monday October 14 2024 10:06:20 EST Ventricular Rate:  50 PR Interval:  182 QRS Duration:  86 QT Interval:  496 QTC Calculation: 452 R Axis:   46  Text Interpretation: Sinus bradycardia Cannot rule out Anterior infarct (cited on or before 06-Aug-2024) Abnormal ECG When compared with ECG of 07-Oct-2024 08:45,  Sinus rhythm has replaced atrial fibrillation Confirmed by Terra Pac ([id number]) on 10/14/2024 10:56:35 AM     ECG intervals are stable.  After discussion with patient, will decrease dose of flecainide  to 50 mg twice daily and decrease dose of Toprol  to 12.5 mg daily.  Patient will contact office in 2 days with update.  If she has ongoing shortness of breath that is not improved, we will discontinue medications and likely transition patient to Multaq after a 3-day washout.  She will contact clinic as directed.  I will hold off on ordering TST in the event that patient wishes to discontinue flecainide .   Addendum: 10/16/2024 -after reduction in dose of flecainide  and Toprol , patient has noted significant improvement and is able to continue both medications without issue.  Due to start of flecainide , will order TST.

## 2024-10-14 NOTE — Patient Instructions (Addendum)
 decrease flecainide  to 50 mg twice a day   Decrease metoprolol  to 12.5 mg ( 1/2 tab ) once daily

## 2024-10-15 LAB — CUP PACEART REMOTE DEVICE CHECK
Date Time Interrogation Session: 20251213233438
Implantable Pulse Generator Implant Date: 20250807

## 2024-10-16 ENCOUNTER — Telehealth (HOSPITAL_COMMUNITY): Payer: Self-pay | Admitting: *Deleted

## 2024-10-16 ENCOUNTER — Other Ambulatory Visit (HOSPITAL_COMMUNITY): Payer: Self-pay

## 2024-10-16 DIAGNOSIS — Z5181 Encounter for therapeutic drug level monitoring: Secondary | ICD-10-CM

## 2024-10-16 DIAGNOSIS — I480 Paroxysmal atrial fibrillation: Secondary | ICD-10-CM

## 2024-10-16 NOTE — Telephone Encounter (Signed)
 Pt called with update of recent med changes. She is feeling much  better and denies any complications at this time. DOROTHA Heinrich P.A. updated and did order for TST/ Stress test as previously discussed  since starting flecainide .

## 2024-10-16 NOTE — Addendum Note (Signed)
 Encounter addended by: Terra Fairy PARAS, PA-C on: 10/16/2024 2:03 PM  Actions taken: Clinical Note Signed, Order list changed, Diagnosis association updated

## 2024-10-18 NOTE — Progress Notes (Signed)
 Remote Loop Recorder Transmission

## 2024-10-25 ENCOUNTER — Ambulatory Visit: Payer: Self-pay | Admitting: Student in an Organized Health Care Education/Training Program

## 2024-10-28 ENCOUNTER — Telehealth (HOSPITAL_COMMUNITY): Payer: Self-pay | Admitting: *Deleted

## 2024-10-28 NOTE — Telephone Encounter (Signed)
 Patient called in stating her apple watch has been notifying her regarding low heart rates in the 40s and a few 30s. She denies symptoms other than intermittently not feeling right but otherwise no symptoms such as dizziness or syncope. Reviewed linq report which had been disconnected since 11/28 - pt reconnected and report did not show any bradycardia which would trigger parameters. After reviewing meds - pt was incorrectly taking metoprolol  twice a day. Pt will go to 12.5mg  once a day of metoprolol  and follow her heart rates on watch. If alerts continue will call back

## 2024-11-05 ENCOUNTER — Telehealth: Payer: Self-pay | Admitting: Student in an Organized Health Care Education/Training Program

## 2024-11-05 NOTE — Telephone Encounter (Signed)
 Called Tammy Erickson to see if she was having ongoing issues with the flecainide  and AF burden however she was taking a higher dose than prescribed which was causing an even lower heart rate than her baseline bradycardia.  For now she is minimally symptomatic and is not having a significant burden so we will keep the currently scheduled appointment as it is.

## 2024-11-13 ENCOUNTER — Ambulatory Visit: Payer: Self-pay | Admitting: Student in an Organized Health Care Education/Training Program

## 2024-11-13 ENCOUNTER — Ambulatory Visit

## 2024-11-13 DIAGNOSIS — I480 Paroxysmal atrial fibrillation: Secondary | ICD-10-CM | POA: Diagnosis not present

## 2024-11-13 LAB — CUP PACEART REMOTE DEVICE CHECK
Date Time Interrogation Session: 20260113234909
Implantable Pulse Generator Implant Date: 20250807

## 2024-11-14 ENCOUNTER — Encounter

## 2024-11-14 ENCOUNTER — Inpatient Hospital Stay (HOSPITAL_COMMUNITY): Admission: RE | Admit: 2024-11-14 | Source: Ambulatory Visit

## 2024-11-14 ENCOUNTER — Ambulatory Visit (HOSPITAL_COMMUNITY)

## 2024-11-19 NOTE — Progress Notes (Signed)
 Remote Loop Recorder Transmission

## 2024-11-22 ENCOUNTER — Telehealth (HOSPITAL_COMMUNITY): Payer: Self-pay | Admitting: *Deleted

## 2024-11-22 NOTE — Telephone Encounter (Signed)
 Left message for reminder of upcoming GXT on 11/29/24 at 11:30

## 2024-11-26 ENCOUNTER — Other Ambulatory Visit (HOSPITAL_COMMUNITY): Payer: Self-pay | Admitting: *Deleted

## 2024-11-26 MED ORDER — METOPROLOL SUCCINATE ER 25 MG PO TB24: 12.5000 mg | ORAL_TABLET | Freq: Every day | ORAL | 3 refills | Status: AC

## 2024-11-29 ENCOUNTER — Ambulatory Visit (HOSPITAL_COMMUNITY)
Admission: RE | Admit: 2024-11-29 | Discharge: 2024-11-29 | Disposition: A | Discharge status: Home / Self Care | Source: Ambulatory Visit | Attending: Internal Medicine | Admitting: Internal Medicine

## 2024-11-29 ENCOUNTER — Ambulatory Visit (HOSPITAL_BASED_OUTPATIENT_CLINIC_OR_DEPARTMENT_OTHER)
Admission: RE | Admit: 2024-11-29 | Discharge: 2024-11-29 | Disposition: A | Discharge status: Home / Self Care | Source: Ambulatory Visit | Attending: Internal Medicine | Admitting: Internal Medicine

## 2024-11-29 DIAGNOSIS — I480 Paroxysmal atrial fibrillation: Secondary | ICD-10-CM

## 2024-11-29 DIAGNOSIS — Z5181 Encounter for therapeutic drug level monitoring: Secondary | ICD-10-CM

## 2024-11-29 DIAGNOSIS — D6869 Other thrombophilia: Secondary | ICD-10-CM | POA: Diagnosis present

## 2024-11-29 DIAGNOSIS — Z79899 Other long term (current) drug therapy: Secondary | ICD-10-CM

## 2024-11-29 LAB — EXERCISE TOLERANCE TEST
Angina Index: 0
Base ST Depression (mm): 0 mm
Duke Treadmill Score: 7
Estimated workload: 8.5
Exercise duration (min): 7 min
Exercise duration (sec): 1 s
MPHR: 144 {beats}/min
Peak HR: 108 {beats}/min
Percent HR: 75 %
RPE: 18
Rest HR: 49 {beats}/min
ST Depression (mm): 0 mm

## 2024-11-29 LAB — ECHOCARDIOGRAM COMPLETE
Area-P 1/2: 2.32 cm2
S' Lateral: 1.9 cm

## 2024-12-02 ENCOUNTER — Ambulatory Visit (HOSPITAL_COMMUNITY): Payer: Self-pay | Admitting: Internal Medicine

## 2024-12-06 ENCOUNTER — Ambulatory Visit: Admitting: Student in an Organized Health Care Education/Training Program

## 2024-12-06 ENCOUNTER — Encounter: Payer: Self-pay | Admitting: Student in an Organized Health Care Education/Training Program

## 2024-12-06 VITALS — BP 192/100 | HR 46 | Ht 62.0 in | Wt 161.0 lb

## 2024-12-06 DIAGNOSIS — I4729 Other ventricular tachycardia: Secondary | ICD-10-CM

## 2024-12-06 DIAGNOSIS — I10 Essential (primary) hypertension: Secondary | ICD-10-CM

## 2024-12-06 DIAGNOSIS — R002 Palpitations: Secondary | ICD-10-CM

## 2024-12-06 DIAGNOSIS — I480 Paroxysmal atrial fibrillation: Secondary | ICD-10-CM

## 2024-12-06 DIAGNOSIS — Z5181 Encounter for therapeutic drug level monitoring: Secondary | ICD-10-CM

## 2024-12-06 DIAGNOSIS — Z01812 Encounter for preprocedural laboratory examination: Secondary | ICD-10-CM

## 2024-12-06 LAB — BASIC METABOLIC PANEL WITH GFR

## 2024-12-06 LAB — CBC
Hematocrit: 39.3 % (ref 34.0–46.6)
Hemoglobin: 13.2 g/dL (ref 11.1–15.9)
MCH: 30.7 pg (ref 26.6–33.0)
MCHC: 33.6 g/dL (ref 31.5–35.7)
MCV: 91 fL (ref 79–97)
Platelets: 214 10*3/uL (ref 150–450)
RBC: 4.3 x10E6/uL (ref 3.77–5.28)
RDW: 12.8 % (ref 11.7–15.4)
WBC: 6.3 10*3/uL (ref 3.4–10.8)

## 2024-12-06 MED ORDER — LOSARTAN POTASSIUM-HCTZ 100-25 MG PO TABS: 1.0000 | ORAL_TABLET | Freq: Every day | ORAL | 12 refills | Status: AC

## 2024-12-06 NOTE — Progress Notes (Unsigned)
" °  Cardiology Office Note   Date:  12/06/2024  ID:  Tammy Erickson, DOB [birth year], MRN [id number] PCP: Tammy Reagin, PA  [facility] Providers Cardiologist:  Tammy Shallow, MD Electrophysiologist:  Tammy ONEIDA HOLTS, MD (Inactive) { Click to update primary MD,subspecialty MD or APP then REFRESH:1}    History of Present Illness Tammy Erickson is a 79 y.o. female ***  ROS: ***  Studies Reviewed      *** Risk Assessment/Calculations {Does this patient have ATRIAL FIBRILLATION?:[phone]} No BP recorded.  {Refresh Note OR Click here to enter BP  :1}***       Physical Exam VS:  There were no vitals taken for this visit.       Wt Readings from Last 3 Encounters:  10/07/24 168 lb 9.6 oz (76.5 kg)  08/06/24 164 lb 9.6 oz (74.7 kg)  06/06/24 163 lb (73.9 kg)    GEN: Well nourished, well developed in no acute distress NECK: No JVD; No carotid bruits CARDIAC: ***RRR, no murmurs, rubs, gallops RESPIRATORY:  Clear to auscultation without rales, wheezing or rhonchi  ABDOMEN: Soft, non-tender, non-distended EXTREMITIES:  No edema; No deformity   ASSESSMENT AND PLAN ***    {Are you ordering a CV Procedure (e.g. stress test, cath, DCCV, TEE, etc)?   Press F2        :[id number]}  Dispo: ***  Signed, Tammy DELENA Primus, MD  "

## 2024-12-06 NOTE — Patient Instructions (Signed)
 Medication Instructions:  Your physician recommends that you continue on your current medications as directed. Please refer to the Current Medication list given to you today.  *If you need a refill on your cardiac medications before your next appointment, please call your pharmacy*  Testing/Procedures: Ablation Your physician has recommended that you have an ablation. Catheter ablation is a medical procedure used to treat some cardiac arrhythmias (irregular heartbeats). During catheter ablation, a long, thin, flexible tube is put into a blood vessel in your groin (upper thigh), or neck. This tube is called an ablation catheter. It is then guided to your heart through the blood vessel. Radio frequency waves destroy small areas of heart tissue where abnormal heartbeats may cause an arrhythmia to start.   You are scheduled for Atrial Fibrillation Ablation on Monday, February 23 with Dr. Dr. Almetta. Please arrive at the Main Entrance A at [facility]: [address] [place], [place] [address] at 7:30 AM   What To Expect:  Labs: you will need to have lab work drawn within 30 days of your procedure. Please go to any [facility] location to have these drawn - no appointment is needed. You will receive procedure instructions either through MyChart or in the mail 4-6 week prior to your procedure.  After your procedure we recommend no driving for 4 days, no lifting over 5 lbs for 7 days, and no work or strenuous activity for 7 days.  Please contact our office at [phone] if you have any questions.    Follow-Up: We will contact you to schedule your post-procedure appointments.

## 2024-12-14 ENCOUNTER — Encounter

## 2024-12-23 ENCOUNTER — Ambulatory Visit (HOSPITAL_COMMUNITY): Admit: 2024-12-23 | Admitting: Student in an Organized Health Care Education/Training Program

## 2024-12-23 ENCOUNTER — Encounter (HOSPITAL_COMMUNITY): Payer: Self-pay

## 2025-01-14 ENCOUNTER — Encounter

## 2025-02-12 ENCOUNTER — Ambulatory Visit (HOSPITAL_COMMUNITY): Admitting: Internal Medicine

## 2025-02-14 ENCOUNTER — Encounter

## 2025-03-17 ENCOUNTER — Encounter

## 2025-04-17 ENCOUNTER — Encounter

## 2025-05-18 ENCOUNTER — Encounter
